# Patient Record
Sex: Female | Born: 1955 | Race: White | Hispanic: No | Marital: Married | Smoking: Former smoker
Health system: Southern US, Community
[De-identification: ages and names within clinical notes are randomized; demographics above are authoritative.]

## PROBLEM LIST (undated history)

## (undated) DIAGNOSIS — M419 Scoliosis, unspecified: Secondary | ICD-10-CM

## (undated) DIAGNOSIS — M545 Low back pain, unspecified: Secondary | ICD-10-CM

## (undated) DIAGNOSIS — N3289 Other specified disorders of bladder: Secondary | ICD-10-CM

## (undated) DIAGNOSIS — G8929 Other chronic pain: Secondary | ICD-10-CM

## (undated) DIAGNOSIS — M2540 Effusion, unspecified joint: Secondary | ICD-10-CM

## (undated) DIAGNOSIS — M1990 Unspecified osteoarthritis, unspecified site: Secondary | ICD-10-CM

## (undated) DIAGNOSIS — Z9289 Personal history of other medical treatment: Secondary | ICD-10-CM

## (undated) DIAGNOSIS — Z780 Asymptomatic menopausal state: Secondary | ICD-10-CM

## (undated) DIAGNOSIS — G629 Polyneuropathy, unspecified: Secondary | ICD-10-CM

## (undated) DIAGNOSIS — L409 Psoriasis, unspecified: Secondary | ICD-10-CM

## (undated) DIAGNOSIS — M2550 Pain in unspecified joint: Secondary | ICD-10-CM

## (undated) HISTORY — PX: BACK SURGERY: SHX140

## (undated) HISTORY — PX: COLONOSCOPY: SHX174

## (undated) HISTORY — PX: REFRACTIVE SURGERY: SHX103

## (undated) HISTORY — PX: ESOPHAGOGASTRODUODENOSCOPY: SHX1529

---

## 1989-06-05 HISTORY — PX: CHOLECYSTECTOMY: SHX55

## 1989-06-05 HISTORY — PX: ABDOMINAL HYSTERECTOMY: SHX81

## 1989-06-05 HISTORY — PX: APPENDECTOMY: SHX54

## 1998-10-05 HISTORY — PX: ANTERIOR FUSION CERVICAL SPINE: SUR626

## 2010-11-05 HISTORY — PX: LAMINECTOMY: SHX219

## 2010-11-07 ENCOUNTER — Encounter (HOSPITAL_COMMUNITY): Payer: BC Managed Care – PPO

## 2010-11-07 ENCOUNTER — Other Ambulatory Visit: Payer: Self-pay | Admitting: Orthopedic Surgery

## 2010-11-07 ENCOUNTER — Ambulatory Visit (HOSPITAL_COMMUNITY)
Admission: RE | Admit: 2010-11-07 | Discharge: 2010-11-07 | Disposition: A | Discharge status: Home / Self Care | Payer: BC Managed Care – PPO | Source: Ambulatory Visit | Attending: Orthopedic Surgery | Admitting: Orthopedic Surgery

## 2010-11-07 ENCOUNTER — Other Ambulatory Visit (HOSPITAL_COMMUNITY): Payer: Self-pay | Admitting: Orthopedic Surgery

## 2010-11-07 DIAGNOSIS — M4800 Spinal stenosis, site unspecified: Secondary | ICD-10-CM

## 2010-11-07 DIAGNOSIS — IMO0002 Reserved for concepts with insufficient information to code with codable children: Secondary | ICD-10-CM

## 2010-11-07 DIAGNOSIS — Z01818 Encounter for other preprocedural examination: Secondary | ICD-10-CM | POA: Insufficient documentation

## 2010-11-07 LAB — CBC
MCHC: 33.7 g/dL (ref 30.0–36.0)
Platelets: 189 10*3/uL (ref 150–400)
RDW: 12.9 % (ref 11.5–15.5)
WBC: 7.4 10*3/uL (ref 4.0–10.5)

## 2010-11-07 LAB — COMPREHENSIVE METABOLIC PANEL
ALT: 18 U/L (ref 0–35)
AST: 18 U/L (ref 0–37)
Alkaline Phosphatase: 53 U/L (ref 39–117)
CO2: 29 mEq/L (ref 19–32)
Calcium: 9.4 mg/dL (ref 8.4–10.5)
GFR calc Af Amer: >60 mL/min (ref >60)
GFR calc non Af Amer: >60 mL/min (ref >60)
Potassium: 4 mEq/L (ref 3.5–5.1)
Sodium: 142 mEq/L (ref 135–145)

## 2010-11-07 LAB — DIFFERENTIAL
Basophils Absolute: 0 10*3/uL (ref 0.0–0.1)
Basophils Relative: 0 % (ref 0–1)
Eosinophils Relative: 2 % (ref 0–5)
Monocytes Absolute: 0.6 10*3/uL (ref 0.1–1.0)

## 2010-11-07 LAB — URINE MICROSCOPIC-ADD ON

## 2010-11-07 LAB — URINALYSIS, ROUTINE W REFLEX MICROSCOPIC
Leukocytes, UA: NEGATIVE
Nitrite: POSITIVE — AB
Protein, ur: NEGATIVE mg/dL
Specific Gravity, Urine: 1.017 (ref 1.005–1.030)
Urobilinogen, UA: 0.2 mg/dL (ref 0.0–1.0)

## 2010-11-07 LAB — VITAMIN D 25 HYDROXY (VIT D DEFICIENCY, FRACTURES): Vit D, 25-Hydroxy: 36 ng/mL (ref 30–89)

## 2010-11-07 LAB — PROTIME-INR: Prothrombin Time: 12.5 seconds (ref 11.6–15.2)

## 2010-11-11 ENCOUNTER — Ambulatory Visit (HOSPITAL_COMMUNITY)
Admission: RE | Admit: 2010-11-11 | Payer: BC Managed Care – PPO | Source: Ambulatory Visit | Admitting: Orthopedic Surgery

## 2010-11-11 ENCOUNTER — Ambulatory Visit (HOSPITAL_COMMUNITY): Payer: BC Managed Care – PPO

## 2010-11-11 ENCOUNTER — Inpatient Hospital Stay (HOSPITAL_COMMUNITY)
Admission: RE | Admit: 2010-11-11 | Discharge: 2010-11-12 | DRG: 758 | Disposition: A | Discharge status: Home / Self Care | Payer: BC Managed Care – PPO | Source: Ambulatory Visit | Attending: Orthopedic Surgery | Admitting: Orthopedic Surgery

## 2010-11-11 DIAGNOSIS — Z87891 Personal history of nicotine dependence: Secondary | ICD-10-CM

## 2010-11-11 DIAGNOSIS — M48061 Spinal stenosis, lumbar region without neurogenic claudication: Principal | ICD-10-CM | POA: Diagnosis present

## 2010-11-11 LAB — TYPE AND SCREEN
ABO/RH(D): A POS
Antibody Screen: NEGATIVE

## 2010-11-11 LAB — ABO/RH: ABO/RH(D): A POS

## 2010-11-18 NOTE — Op Note (Signed)
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  POSTOPERATIVE DIAGNOSIS:  Left L4-5 stenosis, no disk herniation identified.  OPERATIVE PROCEDURE:  Left L4-5 laminar foraminotomy/decompression L5 nerve root.  OPERATIVE NOTE:  The patient was placed under general endotracheal anesthesia.  Antibiotics were infused intravenously for prophylaxis against infection.  Sequential compression devices were placed on both lower extremities.  I had previously seen the patient in the preoperative area and marked her back on the left side, confirming that her pain was left sciatic.  She was positioned prone on the [place] frame.  Care was taken to position the upper extremities so as to avoid hyperflexion and abduction of the shoulders so as to avoid hyperflexion of the elbows.  The upper extremities were padded with foam from axilla to hands.  The thighs, knees, shins, and ankles were supported on pillows.  A small midline skin mark was made over the incision was proposed based upon my judgment as to the location of the L4-5 disk space.  The lumbar area was then prepped with DuraPrep and draped in a rectangular fashion. The drapes were secured with Ioban.  A time-out was held at which point the usual information was confirmed/discussed.  The skin was scored in line with the proposed incision into the dermis. The subdermal and subcutaneous tissue was then injected with a mixture of 0.5% plain Marcaine and 1% lidocaine with epinephrine.  Incision was then  deepened down the thoracolumbar fascia and the fascia cast detached on the left side from the spinous process of what was perceived to be L4 and L5.  Paraspinal muscle was reflected to the left side.  Cross-table lateral radiograph was taken with a Kocher on what I had thought was probably L4 spinous process, but proved to be the L5 spinous process. This did produce some confusion and eventually I took another radiograph before performing a laminotomy with a Kocher on the trailing edge of what I had identified to be L5, immediately above the last mobile segment.  This confirmed our position and correct level was again confirmed later on the case with an 18-gauge needle placed in the disk itself.  We performed an inferomedial facetectomy of L4 on the left side using a high-speed bur.  Superior articular process facetectomy was also performed underlying the inferior articular process using Kerrison rongeurs.  Ligamentum flavum was detached from the upper edge of L5 and removed in the laminotomy site.  Eventually we had a defect measuring about 1.5 cm.  I was able to mobilize the dura and origins of the L5 nerve root and exposed the L4-5 disk.  Epidural veins were coagulated. I  was able to retract the nerve medial to the L5 pedicle in the area where the small disk herniation appeared to have its distal extent.  I had exposed the posterior aspect of the vertebra of L5 from disk to medial aspect of pedicle and could find no evidence of a fragment of disk nor defect in the disk space.  However, as I decompressed the lateral recess at L5 it was obvious that the L5 nerve root was compressed.  As noted above, a confirmatory radiograph was taken with a spinal needle in the L4-5 disk to confirm that we were at the intended level.  I used a Murphy ball probe to probe down into the L5-S1 neural foramen and also the L4-5 neural foramen above, which seems not to be compressed.  We then irrigated  the wound with antibiotic solution.  An 8-inch Hemovac drain was placed subfascially, brought out through the skin to the left side, and secured with a 2-0 nylon suture.  The thoracolumbar fascia was closed using multiple #1 Vicryl sutures.  Subcutaneous tissue was closed using multiple 2-0 undyed inverted simple sutures of Vicryl.  The skin was then closed using subcuticular 3-0 undyed Vicryl in a running fashion.  Nonadherent antibiotic ointment dressing was applied.  Blood loss estimated less than 100 mL.  There were no intraoperative complications.  At the time of dictation, the patient has not yet returned to the recovery room, so no neurologic examination is reported here.     Nelda Severe, MD     MT/MEDQ  D:  11/11/2010  T:  11/11/2010  Job:  [id number]  Electronically Signed by Nelda Severe MD on 11/18/2010 09:43:59 AM

## 2010-11-27 NOTE — Discharge Summary (Signed)
  [name], [name]                 ACCOUNT NO.:  [id number]  MEDICAL RECORD NO.:  [id number]           PATIENT TYPE:  I  LOCATION:  5029                         FACILITY:  [facility]  PHYSICIAN:  Nelda Severe, MD      DATE OF BIRTH:  [birth year]  DATE OF ADMISSION:  11/11/2010 DATE OF DISCHARGE:  11/12/2010                              DISCHARGE SUMMARY   FINAL DIAGNOSIS:  L4-L5 stenosis, left side.  BRIEF HISTORY:  She was brought to [facility] on November 11, 2010.  She underwent laminectomy, L4-L5.  Estimated blood loss was 100 mL.  No complications.  Postop day #1, the patient is ambulating independently throughout her room and in the hallway holding to the IV pole and she states her pain is significantly better like "not in day." She is afebrile.  Vital signs are stable.  I discontinued the drain and applied a clean dry dressing.  Incision was clean, dry, and intact.  No active drainage.  No erythema.  Distally neurovascularly motor intact. She has a 0/5 EHL on the left.  PLAN:  She will be discharged home under the care of her family.  She will walk for exercise.  No bending, stooping, or lifting.  She will be discharged with hydrocodone 10/325 one to two q.4 p.r.n. for pain and Robaxin 500 one to two q.6 p.r.n. for muscle spasms.  She will follow up in our office in approximately 4 weeks.  DISPOSITION:  Stable.  DIET:  Regular.     Lianne Cure, P.A.   ______________________________ Nelda Severe, MD    MC/MEDQ  D:  11/12/2010  T:  11/12/2010  Job:  [id number]  Electronically Signed by Lianne Cure P.A. on 11/21/2010 09:27:20 AM Electronically Signed by Nelda Severe MD on 11/27/2010 01:25:31 PM

## 2011-07-03 ENCOUNTER — Encounter (HOSPITAL_COMMUNITY)
Admission: RE | Admit: 2011-07-03 | Discharge: 2011-07-03 | Disposition: A | Discharge status: Home / Self Care | Payer: BC Managed Care – PPO | Source: Ambulatory Visit | Attending: Orthopedic Surgery | Admitting: Orthopedic Surgery

## 2011-07-03 ENCOUNTER — Other Ambulatory Visit (HOSPITAL_COMMUNITY): Payer: Self-pay | Admitting: Orthopedic Surgery

## 2011-07-03 DIAGNOSIS — M5430 Sciatica, unspecified side: Secondary | ICD-10-CM

## 2011-07-03 LAB — COMPREHENSIVE METABOLIC PANEL
ALT: 20 U/L (ref 0–35)
AST: 19 U/L (ref 0–37)
Albumin: 3.7 g/dL (ref 3.5–5.2)
Alkaline Phosphatase: 65 U/L (ref 39–117)
CO2: 30 mEq/L (ref 19–32)
Chloride: 103 mEq/L (ref 96–112)
GFR calc non Af Amer: >60 mL/min (ref >60)
Potassium: 4 mEq/L (ref 3.5–5.1)
Sodium: 141 mEq/L (ref 135–145)
Total Bilirubin: 0.6 mg/dL (ref 0.3–1.2)

## 2011-07-03 LAB — URINALYSIS, ROUTINE W REFLEX MICROSCOPIC
Bilirubin Urine: NEGATIVE
Glucose, UA: NEGATIVE mg/dL
Hgb urine dipstick: NEGATIVE
Ketones, ur: NEGATIVE mg/dL
Leukocytes, UA: NEGATIVE
Protein, ur: NEGATIVE mg/dL

## 2011-07-03 LAB — TYPE AND SCREEN

## 2011-07-03 LAB — CBC
MCH: 30.9 pg (ref 26.0–34.0)
Platelets: 207 10*3/uL (ref 150–400)
RBC: 4.33 MIL/uL (ref 3.87–5.11)
RDW: 12.8 % (ref 11.5–15.5)

## 2011-07-03 LAB — DIFFERENTIAL
Basophils Relative: 0 % (ref 0–1)
Eosinophils Absolute: 0.2 10*3/uL (ref 0.0–0.7)
Eosinophils Relative: 2 % (ref 0–5)
Monocytes Relative: 6 % (ref 3–12)
Neutrophils Relative %: 59 % (ref 43–77)

## 2011-07-03 LAB — URINE MICROSCOPIC-ADD ON

## 2011-07-03 LAB — PROTIME-INR: Prothrombin Time: 13.2 seconds (ref 11.6–15.2)

## 2011-07-06 HISTORY — PX: SPINAL CORD STIMULATOR INSERTION: SHX5378

## 2011-07-07 ENCOUNTER — Inpatient Hospital Stay (HOSPITAL_COMMUNITY)
Admission: RE | Admit: 2011-07-07 | Discharge: 2011-07-10 | DRG: 757 | Disposition: A | Discharge status: Home / Self Care | Payer: BC Managed Care – PPO | Source: Ambulatory Visit | Attending: Orthopedic Surgery | Admitting: Orthopedic Surgery

## 2011-07-07 ENCOUNTER — Ambulatory Visit (HOSPITAL_COMMUNITY): Payer: BC Managed Care – PPO

## 2011-07-07 DIAGNOSIS — T85695A Other mechanical complication of other nervous system device, implant or graft, initial encounter: Secondary | ICD-10-CM | POA: Diagnosis not present

## 2011-07-07 DIAGNOSIS — G8929 Other chronic pain: Secondary | ICD-10-CM | POA: Diagnosis present

## 2011-07-07 DIAGNOSIS — Z01812 Encounter for preprocedural laboratory examination: Secondary | ICD-10-CM

## 2011-07-07 DIAGNOSIS — Z01818 Encounter for other preprocedural examination: Secondary | ICD-10-CM

## 2011-07-07 DIAGNOSIS — M961 Postlaminectomy syndrome, not elsewhere classified: Principal | ICD-10-CM | POA: Diagnosis present

## 2011-07-07 DIAGNOSIS — F411 Generalized anxiety disorder: Secondary | ICD-10-CM | POA: Diagnosis present

## 2011-07-07 DIAGNOSIS — G609 Hereditary and idiopathic neuropathy, unspecified: Secondary | ICD-10-CM | POA: Diagnosis present

## 2011-07-07 DIAGNOSIS — Y831 Surgical operation with implant of artificial internal device as the cause of abnormal reaction of the patient, or of later complication, without mention of misadventure at the time of the procedure: Secondary | ICD-10-CM | POA: Diagnosis not present

## 2011-07-07 DIAGNOSIS — E669 Obesity, unspecified: Secondary | ICD-10-CM | POA: Diagnosis present

## 2011-07-09 ENCOUNTER — Inpatient Hospital Stay (HOSPITAL_COMMUNITY): Payer: BC Managed Care – PPO

## 2011-07-17 NOTE — Op Note (Signed)
NAMEMILISA, KIMBELL NO.:  [id number]  MEDICAL RECORD NO.:  [id number]  LOCATION:                                 FACILITY:  PHYSICIAN:  Nelda Severe, MD      DATE OF BIRTH:  [birth year]  DATE OF PROCEDURE:  07/09/2011 DATE OF DISCHARGE:                              OPERATIVE REPORT   SURGEON:  Nelda Severe, MD.  ASSISTANT:  Lianne Cure, PA.  PREOPERATIVE DIAGNOSIS:  Status post insertion of spinal cord stimulator, electrodes, and IPG with nonfunctioning electrode.  POSTOPERATIVE DIAGNOSIS:  Status post insertion of spinal cord stimulator, electrodes, and IPG with nonfunctioning electrode.  OPERATIVE PROCEDURE:  Revision of thoracic electrode positioning, re- exploration of thoracic wound.  OPERATIVE NOTE:  The patient was placed under general endotracheal anesthesia.  A gram of vancomycin had been infused for prophylaxis against infection.  Sequential compression devices were placed on both lower extremities.  She was positioned prone on a [place] frame.  Care was taken to position the upper extremities so as to avoid hyperflexion and abduction of the shoulders and so as to avoid hyperflexion of the elbows.  The thighs, knees, shins, and ankles were supported with pillows.  The previously applied dressings were removed.  The thoracolumbar area including the left-sided posterolateral wound where the extensions to the electrodes were coupled was prepped using Betadine solution.  The drapes were secured with Ioban.  A time-out was held, at which point, the usual parameters were discussed/confirmed.  I began by excising in elliptical fashion the thoracic laminectomy wound.  Prior to making the incision, I injected through intact skin on either side at the previous incision with a mixture of 0.5% plain Marcaine and 1% lidocaine with epinephrine.  We removed the remaining subcutaneous sutures and sutures in the thoracolumbar fascia.  Hematoma was  sucked out of the wound and then retractors were carefully placed so as to avoid damaging the electrode wires.  The problem at hand was a paddle electrode which was positioned such that the right-sided contacts were not functioning in terms of relieving her pain/covering her pain.  We had considerable difficulty at the time of the original insertion beginning the paddle to assume a central position with one rolled electrodes right of center and the other rolled electrodes left of center.  Ultimately, after approximately 10 trial passes, we accepted positioned which the right side electrodes were approximately in the center of the spine.  Initially, we inserted a single wire electrode to the right of the paddle electrode.  This seemed be acceptable in position, and we then exposed the area where the extensions had been spliced to the paddle electrode wires in a separate posterolateral incision.  This incision was also excised and the wires exposed and uncoupled.  We were in the process of trying to determine which wire belong to the left and right-sided electrode array when we had difficulty decoupling an electrode from the extension.  We had actually decoupled one electrode and tacked to the [employer] scientific rep tested it and we had the rod electrode, there being no real way to identify which one was right, which one was left  at a point remote from the exit from the laminectomy.  This electrode was then replaced and the coupling tightened down and [employer] rep tested, the system 1 or 2 contacts were not functioning.  Became evident that the electrode had to be inserted further into the coupling and unfortunately it had already been torqued.  We then could not extricated even though the torque screw was undone.  At this point, it became evident that we were going to have to remove the paddle electrode and insert another single electrode and probably replace the extensions.   The electrodes from the paddle were then cut in the remote wound and the paddle extricated.  In the meantime, my assistant, Lianne Cure, PA-C persisted in trying to decouple the stump of the electrode from the extension, eventually he was able to extricate it successfully.  At this point, we proceeded with considerable difficulty, replaced two single electrodes.  The real problem was the one on the right side. After removing the paddle electrode, the single strand electrode displaced and so we started a fresh with two single electrodes.  The left one assumed a good position right away, and not surprisingly we had tremendous difficulties with the right-sided one, securing it into correct position.  This was the same problem we had with the original insertion of the paddle electrode, insofar as there was always a tendency for the electrode to end up more left and right.  However, eventually, after probably 10-12 passes, we were able to get a single electrode, using fluoroscopic guidance, in a good position behind the T8 vertebral body on the right side.  As noted the left-sided electrode had been placed without too much difficulty.  We then secured anchors to the electrode and sutured the anchors to the spinous process of the distal vertebra at the laminectomy site.  Check AP and lateral fluoroscopy view showed satisfactory position of electrodes.  We then coupled the electrode wires with the extensions in the remote wound and the [employer] scientific rep tested the system and it was working.  All the couplings were then torqued.  The wound was sterilely irrigated with antibiotic solution on several occasions during the procedure.  An 8-inch Hemovac drain was placed subfascially and brought out through the skin to the right side where it was secured with a 2-0 nylon suture.  One gram of vancomycin was dusted into the central thoracic wound.  The thoracolumbar fascia was closed using  interrupted #1 Vicryl sutures.  The subcutaneous layer of both wounds was closed using interrupted 2-0 Vicryl suture.  The skin was closed with subcuticular running 3-0 undyed Vicryl on the remote site and interrupted nylon on the thoracic laminectomy site.  Dermabond was applied to the remote site and an antibiotic ointment dressing applied to the central wound.  Both dressings were secured with tape.  The blood loss estimated at less than 100 mL.  There were no intraoperative complications.  Sponge and needle counts were correct.     Nelda Severe, MD     MT/MEDQ  D:  07/09/2011  T:  07/09/2011  Job:  [id number]  Electronically Signed by Nelda Severe MD on 07/17/2011 02:15:11 PM

## 2011-07-17 NOTE — Op Note (Signed)
[name], [name] NO.:  [id number]  MEDICAL RECORD NO.:  [id number]  LOCATION:  5037                         FACILITY:  [facility]  PHYSICIAN:  Nelda Severe, MD      DATE OF BIRTH:  1956-03-23  DATE OF PROCEDURE: DATE OF DISCHARGE:                              OPERATIVE REPORT   SURGEON:  Nelda Severe, MD  ASSISTANT:  Lianne Cure, PA-C  PREOPERATIVE DIAGNOSIS:  Lumbar laminectomy syndrome, status post successful trial of spinal cord stimulator.  POSTOPERATIVE DIAGNOSIS:  Lumbar laminectomy syndrome, status post successful trial of spinal cord stimulator.  OPERATIVE PROCEDURE: 1. Thoracic laminectomy for insertion of permanent spinal cord     stimulator electrodes. 2. Placement of internal pulse generator left upper quadrant abdominal     area (subcutaneous).  OPERATIVE NOTE:  The patient was placed under general endotracheal anesthesia.  Sequential compression foot devices were placed on both lower extremities.  Intravenous antibiotics were infused.  A Foley catheter was placed in the bladder.  She was positioned prone on a [place] frame.  Care was taken to position the upper extremities so as to avoid hyperflexion and abduction of the shoulders so as to avoid hyperflexion of the elbows.  Upper extremities were padded from axilla to hands with foam.  Thighs, knees, shins, and ankles were supported on pillows.  The thoracolumbar area was prepped with DuraPrep and draped in rectangular fashion and the drape was secured with Ioban.  A time-out was held at which point the usual information was confirmed/discussed.  A C-arm fluoroscopy unit was sterilely draped.  It was wheeled into position for performance of AP imaging.  We marked the T12, T11, T10, T9 and T8 levels.  A staple was placed in the skin overlying the T8 level as a marker for placement of the permanent electrode.  A midline incision was made after injection of subcutaneous tissue  with a mixture of 0.5% plain Marcaine and 1% lidocaine with epinephrine. This was a vertical midline incision over the lower thoracic spine. Paraspinal muscles were cleared off the lamina at approximately T10-11. Interspinous ligament was removed as well as the trailing spinous process of the vertebra above.  I used a curette to separate the ligamentum flavum from the trailing edge of the superior vertebra.  I then passed a #3 Cytogeneticist to dissect off the ligamentum flavum from the undersurface of the superior vertebra.  I was then able to pass a plastic malleable instrument supplied with the electrode into the spinal canal.  Then, under fluoroscopic guidance, I positioned the electrode.  I tried repeatedly to get into the center behind the T8 body, but on every attempt it always oriented itself slightly to the left and ended up having to accept that position.  The anchor devices were attached to the electrode wires.  The anchoring devices were then torqued onto the wires.  They were then stitched to the spinous process of the distal vertebra at the laminectomy site through a hole made in the base of the spinous process, with a 2-0 Ethibond suture.  A small incision was then made laterally upon the left side just above the iliac crest.  A passing device was then passed with a cannula over it from a subfascial location to the central wound to the subcutaneous location of this wound.  Electrode wires were passed and buried in that wound and the wound was closed with a simple horizontal mattress suture of 2-0 nylon.  An eighth inch Hemovac drain was then placed subfascially in the laminectomy wound, brought out through the skin to the right side, and sutured with a 2-0 nylon suture.  The closure was then effected using interrupted #1 Vicryl in the thoracolumbar fascia, inverted 2-0 interrupted Vicryl in subcutaneous layer, and continuous 3- 0 undyed Vicryl running in the skin  layer.  The skin edges were reinforced with Dermabond.  A Mepilex dressing was then applied.  The patient was then transferred off the [place] frame onto a regular operating table with a beanbag on it.  The [place] frame was removed from the room.  The patient was then placed in a semilateral position, left side up right side down, so that the wound to which I had passed the electrode wires was accessible as well as the anterior abdomen.  After re-preparation of the area with the buried electrode as well as the abdomen with Betadine solution, drapes were applied and secured with Ioban.  A second time-out was held.  Next, I made an incision obliquely in the subcostal area on the left side.  This was carried into the subcutaneous tissue and a pocket made to contain the internal pulse generator.  We then opened the previously made small incision with the buried electrode wires and used the similar passing device, trocar with polyethylene cannula to create a tunnel between the two wounds and then place attachment extension electrodes onto the buried electrodes and passed them into the anterior wound. They were then attached to the internal pulse generator.  It was tested and functioned.  We then closed both wounds.  I then attempted using the charging device in a sterile bag to test the ability to charge the IPG. It did seem that the IPG must have been too deeply implanted. Therefore, I took out the stitches.  I used an interrupted 2-0 Vicryl suture in the more medial part of the wound to prevent the implant from migrating in that direction and created a somewhat more superficial pocket laterally.  I then closed that wound using 2-0 interrupted Vicryl in the subcutaneous tissue.  The charging device worked.  We then closed the skin with continuous subcuticular 3-0 undyed Vicryl and reinforced the skin edges with Dermabond.  A Mepilex dressing was applied.  The patient was then transferred  to her bed, awakened, and returned to the recovery room in satisfactory condition at the time of dictation.  She is able to move both lower extremities without difficulty.  The IPG will be turned on by the [facility] rep tomorrow.  There were no intraoperative complications.  Blood loss approximately 100 mL or less.  Sponge and needle counts were correct.     Nelda Severe, MD    MT/MEDQ  D:  07/07/2011  T:  07/08/2011  Job:  [id number]  Electronically Signed by Nelda Severe MD on 07/17/2011 02:15:09 PM

## 2011-07-17 NOTE — Discharge Summary (Signed)
  NAMEALLESHA, ARONOFF NO.:  [id number]  MEDICAL RECORD NO.:  [id number]  LOCATION:  5037                         FACILITY:  [facility]  PHYSICIAN:  Nelda Severe, MD      DATE OF BIRTH:  25-May-1956  DATE OF ADMISSION:  07/07/2011 DATE OF DISCHARGE:  07/10/2011                              DISCHARGE SUMMARY   FINAL DIAGNOSIS:  Post laminectomy syndrome.  BRIEF HISTORY:  She was brought into [facility] on July 07, 2011.  She underwent permanent spinal cord stimulator implant on July 07, 2011.  Estimated blood loss 100 mL.  Moving extremities. Postoperatively, she was admitted to 5037.  [employer] engineer, Angelena Form came in to adjust the stimulator the postop day #1 and she was getting only left-sided coverage.  Our plan was to revise the stimulator.  Revision was performed on July 09, 2011.  We removed the single pad and 2 separate electrodes one to the right and one to the left.  Postop day #1 from second surgery revision of stimulator on July 10, 2011, she is receiving coverage bilaterally. Her incision is clean, dry and intact.  She is ambulating independently throughout the halls.  No new musculoskeletal complaints.  She is going to be discharged under the care of her sister.  She will follow up in our office in 1 week for suture removal and x-rays for placement and positioning.  She will be sent home on her regularly scheduled medications to include Wellbutrin 150 p.o. daily, estradiol 2 mg p.o. b.i.d., multivitamin p.o. daily, hydrocodone 10/325 one to two p.o. q. 4 p.r.n., gabapentin 600 mg p.o. t.i.d., and we will also give her a new prescription for Robaxin 500 one q.6 p.r.n. for muscle spasms.  She will follow up in our office in approximately a week.  DIET:  Regular.  DISPOSITION:  Stable.  ACTIVITIES:  She will walk for exercise.  No bending, stooping, or lifting.  DISCHARGE INSTRUCTIONS:  She may remove the  dressings in 24 hours and shower, apply clean dry dressing if there is drainage.     Lianne Cure, P.A.   ______________________________ Nelda Severe, MD    MC/MEDQ  D:  07/10/2011  T:  07/10/2011  Job:  [id number]  Electronically Signed by Lianne Cure P.A. on 07/13/2011 08:32:44 AM Electronically Signed by Nelda Severe MD on 07/17/2011 02:15:15 PM

## 2012-06-05 HISTORY — PX: GUM SURGERY: SHX658

## 2012-06-14 ENCOUNTER — Encounter (HOSPITAL_COMMUNITY): Payer: Self-pay | Admitting: Pharmacy Technician

## 2012-06-17 ENCOUNTER — Encounter (HOSPITAL_COMMUNITY): Payer: Self-pay

## 2012-06-17 ENCOUNTER — Encounter (HOSPITAL_COMMUNITY)
Admission: RE | Admit: 2012-06-17 | Discharge: 2012-06-17 | Disposition: A | Discharge status: Home / Self Care | Payer: BC Managed Care – PPO | Source: Ambulatory Visit | Attending: Orthopedic Surgery | Admitting: Orthopedic Surgery

## 2012-06-17 HISTORY — DX: Scoliosis, unspecified: M41.9

## 2012-06-17 HISTORY — DX: Asymptomatic menopausal state: Z78.0

## 2012-06-17 HISTORY — DX: Polyneuropathy, unspecified: G62.9

## 2012-06-17 HISTORY — DX: Effusion, unspecified joint: M25.40

## 2012-06-17 HISTORY — DX: Psoriasis, unspecified: L40.9

## 2012-06-17 HISTORY — DX: Unspecified osteoarthritis, unspecified site: M19.90

## 2012-06-17 HISTORY — DX: Other specified disorders of bladder: N32.89

## 2012-06-17 HISTORY — DX: Pain in unspecified joint: M25.50

## 2012-06-17 LAB — COMPREHENSIVE METABOLIC PANEL
ALT: 18 U/L (ref 0–35)
AST: 17 U/L (ref 0–37)
Albumin: 3.7 g/dL (ref 3.5–5.2)
Alkaline Phosphatase: 63 U/L (ref 39–117)
Chloride: 102 mEq/L (ref 96–112)
Potassium: 3.7 mEq/L (ref 3.5–5.1)
Total Bilirubin: 0.6 mg/dL (ref 0.3–1.2)

## 2012-06-17 LAB — CBC
Platelets: 187 10*3/uL (ref 150–400)
RBC: 4.6 MIL/uL (ref 3.87–5.11)
WBC: 8.1 10*3/uL (ref 4.0–10.5)

## 2012-06-17 LAB — URINALYSIS, ROUTINE W REFLEX MICROSCOPIC
Ketones, ur: NEGATIVE mg/dL
Leukocytes, UA: NEGATIVE
Nitrite: NEGATIVE
Specific Gravity, Urine: 1.021 (ref 1.005–1.030)
pH: 5 (ref 5.0–8.0)

## 2012-06-17 LAB — TYPE AND SCREEN

## 2012-06-17 LAB — APTT: aPTT: 26 seconds (ref 24–37)

## 2012-06-17 LAB — SURGICAL PCR SCREEN: Staphylococcus aureus: NEGATIVE

## 2012-06-17 MED ORDER — CHLORHEXIDINE GLUCONATE 4 % EX LIQD
60.0000 mL | Freq: Once | CUTANEOUS | Status: DC
  Filled 2012-06-17: qty 60

## 2012-06-17 NOTE — Progress Notes (Signed)
Pt doesn't have a cardiologist  Denies ever having an echo/heart cath/stress test  Medical MD is Dr.Chung  CXR in epic from 07/02/12  Last ekg was in 11/2010-

## 2012-06-17 NOTE — Pre-Procedure Instructions (Signed)
20 [name]  06/17/2012   Your procedure is scheduled on: Tues, Sept 24 @ 10:10 AM  Report to [facility] at 8:00 AM.  Call this number if you have problems the morning of surgery: [phone]   Remember:   Do not eat food:After Midnight.    Take these medicines the morning of surgery with A SIP OF WATER: Wellbutrin(Buproprion),Gabapentin(Neurontin),and Pain Pill(if needed)   Do not wear jewelry, make-up or nail polish.  Do not wear lotions, powders, or perfumes. You may wear deodorant.  Do not shave 48 hours prior to surgery.   Do not bring valuables to the hospital.  Contacts, dentures or bridgework may not be worn into surgery.  Leave suitcase in the car. After surgery it may be brought to your room.  For patients admitted to the hospital, checkout time is 11:00 AM the day of discharge.   Patients discharged the day of surgery will not be allowed to drive home.  Special Instructions: Incentive Spirometry - Practice and bring it with you on the day of surgery. and CHG Shower Use Special Wash: 1/2 bottle night before surgery and 1/2 bottle morning of surgery.   Please read over the following fact sheets that you were given: Pain Booklet, Coughing and Deep Breathing, Blood Transfusion Information, MRSA Information and Surgical Site Infection Prevention

## 2012-06-22 ENCOUNTER — Other Ambulatory Visit: Payer: Self-pay

## 2012-06-27 MED ORDER — SODIUM CHLORIDE 0.9 % IV SOLN: 75.0000 mL/h | INTRAVENOUS | Status: DC

## 2012-06-27 MED ORDER — CEFAZOLIN SODIUM-DEXTROSE 2-3 GM-% IV SOLR
2.0000 g | INTRAVENOUS | Status: AC
  Administered 2012-06-28: 2 g via INTRAVENOUS
  Filled 2012-06-27: qty 50

## 2012-06-27 NOTE — Progress Notes (Signed)
NOTIFIED PATIENT OF TIME CHANGE, PATIENT INSTRUCTED TO ARRIVE AT 0530 AM FOR 0730 AM SURGERY ON 06/28/12.

## 2012-06-28 ENCOUNTER — Ambulatory Visit (HOSPITAL_COMMUNITY): Payer: BC Managed Care – PPO

## 2012-06-28 ENCOUNTER — Encounter (HOSPITAL_COMMUNITY): Payer: Self-pay | Admitting: Certified Registered"

## 2012-06-28 ENCOUNTER — Encounter (HOSPITAL_COMMUNITY)
Admission: RE | Disposition: A | Discharge status: Home / Self Care | Payer: Self-pay | Source: Ambulatory Visit | Attending: Orthopedic Surgery

## 2012-06-28 ENCOUNTER — Inpatient Hospital Stay (HOSPITAL_COMMUNITY)
Admission: RE | Admit: 2012-06-28 | Discharge: 2012-07-01 | DRG: 884 | Disposition: A | Discharge status: Home / Self Care | Payer: BC Managed Care – PPO | Source: Ambulatory Visit | Attending: Orthopedic Surgery | Admitting: Orthopedic Surgery

## 2012-06-28 ENCOUNTER — Ambulatory Visit (HOSPITAL_COMMUNITY): Payer: BC Managed Care – PPO | Admitting: Certified Registered"

## 2012-06-28 ENCOUNTER — Encounter (HOSPITAL_COMMUNITY): Payer: Self-pay | Admitting: *Deleted

## 2012-06-28 ENCOUNTER — Encounter (HOSPITAL_COMMUNITY): Payer: Self-pay | Admitting: General Practice

## 2012-06-28 DIAGNOSIS — Y831 Surgical operation with implant of artificial internal device as the cause of abnormal reaction of the patient, or of later complication, without mention of misadventure at the time of the procedure: Secondary | ICD-10-CM | POA: Diagnosis present

## 2012-06-28 DIAGNOSIS — Z23 Encounter for immunization: Secondary | ICD-10-CM

## 2012-06-28 DIAGNOSIS — M47816 Spondylosis without myelopathy or radiculopathy, lumbar region: Secondary | ICD-10-CM

## 2012-06-28 DIAGNOSIS — Z01812 Encounter for preprocedural laboratory examination: Secondary | ICD-10-CM

## 2012-06-28 DIAGNOSIS — K560 Paralytic ileus: Secondary | ICD-10-CM | POA: Diagnosis present

## 2012-06-28 DIAGNOSIS — F411 Generalized anxiety disorder: Secondary | ICD-10-CM | POA: Diagnosis present

## 2012-06-28 DIAGNOSIS — Z87891 Personal history of nicotine dependence: Secondary | ICD-10-CM

## 2012-06-28 DIAGNOSIS — K929 Disease of digestive system, unspecified: Secondary | ICD-10-CM | POA: Diagnosis present

## 2012-06-28 DIAGNOSIS — Z01811 Encounter for preprocedural respiratory examination: Secondary | ICD-10-CM

## 2012-06-28 DIAGNOSIS — M5137 Other intervertebral disc degeneration, lumbosacral region: Secondary | ICD-10-CM

## 2012-06-28 DIAGNOSIS — M4120 Other idiopathic scoliosis, site unspecified: Principal | ICD-10-CM | POA: Diagnosis present

## 2012-06-28 HISTORY — DX: Low back pain: M54.5

## 2012-06-28 HISTORY — PX: ANTERIOR LUMBAR FUSION: SHX1170

## 2012-06-28 HISTORY — DX: Low back pain, unspecified: M54.50

## 2012-06-28 HISTORY — DX: Other chronic pain: G89.29

## 2012-06-28 SURGERY — ANTERIOR LUMBAR FUSION 2 LEVELS
Anesthesia: General | Site: Back | Laterality: Bilateral | Wound class: Clean

## 2012-06-28 MED ORDER — SODIUM CHLORIDE 0.9 % IR SOLN
Status: DC | PRN
  Administered 2012-06-28: 09:00:00

## 2012-06-28 MED ORDER — GABAPENTIN 600 MG PO TABS
600.0000 mg | ORAL_TABLET | ORAL | Status: DC
  Administered 2012-06-30: 600 mg via ORAL
  Filled 2012-06-28 (×7): qty 1

## 2012-06-28 MED ORDER — SODIUM CHLORIDE 0.9 % IJ SOLN: 3.0000 mL | INTRAMUSCULAR | Status: DC | PRN

## 2012-06-28 MED ORDER — ROCURONIUM BROMIDE 100 MG/10ML IV SOLN
INTRAVENOUS | Status: DC | PRN
  Administered 2012-06-28: 50 mg via INTRAVENOUS

## 2012-06-28 MED ORDER — METHOCARBAMOL 100 MG/ML IJ SOLN
500.0000 mg | Freq: Four times a day (QID) | INTRAVENOUS | Status: DC | PRN
  Filled 2012-06-28 (×2): qty 5

## 2012-06-28 MED ORDER — LIDOCAINE HCL (PF) 1 % IJ SOLN
INTRAMUSCULAR | Status: AC
  Filled 2012-06-28: qty 30

## 2012-06-28 MED ORDER — PROMETHAZINE HCL 25 MG/ML IJ SOLN: 6.2500 mg | INTRAMUSCULAR | Status: DC | PRN

## 2012-06-28 MED ORDER — DIPHENHYDRAMINE HCL 50 MG/ML IJ SOLN
INTRAMUSCULAR | Status: AC
  Filled 2012-06-28: qty 1

## 2012-06-28 MED ORDER — MORPHINE SULFATE (PF) 1 MG/ML IV SOLN
INTRAVENOUS | Status: AC
  Filled 2012-06-28: qty 25

## 2012-06-28 MED ORDER — HYDROMORPHONE HCL PF 1 MG/ML IJ SOLN
INTRAMUSCULAR | Status: DC | PRN
  Administered 2012-06-28 (×2): 0.5 mg via INTRAVENOUS

## 2012-06-28 MED ORDER — BUPIVACAINE HCL (PF) 0.25 % IJ SOLN
INTRAMUSCULAR | Status: AC
  Filled 2012-06-28: qty 30

## 2012-06-28 MED ORDER — INFLUENZA VIRUS VACC SPLIT PF IM SUSP
0.5000 mL | INTRAMUSCULAR | Status: AC
  Administered 2012-06-29: 0.5 mL via INTRAMUSCULAR
  Filled 2012-06-28: qty 0.5

## 2012-06-28 MED ORDER — LACTATED RINGERS IV SOLN
INTRAVENOUS | Status: DC | PRN
  Administered 2012-06-28 (×3): via INTRAVENOUS

## 2012-06-28 MED ORDER — SODIUM CHLORIDE 0.9 % IV SOLN
10.0000 mg | INTRAVENOUS | Status: DC | PRN
  Administered 2012-06-28: 20 ug/min via INTRAVENOUS

## 2012-06-28 MED ORDER — GABAPENTIN 600 MG PO TABS
1200.0000 mg | ORAL_TABLET | Freq: Every day | ORAL | Status: DC
  Filled 2012-06-28 (×4): qty 2

## 2012-06-28 MED ORDER — POTASSIUM CHLORIDE IN NACL 20-0.45 MEQ/L-% IV SOLN
INTRAVENOUS | Status: DC
  Administered 2012-06-28: 14:00:00 via INTRAVENOUS
  Filled 2012-06-28 (×10): qty 1000

## 2012-06-28 MED ORDER — VECURONIUM BROMIDE 10 MG IV SOLR
INTRAVENOUS | Status: DC | PRN
  Administered 2012-06-28 (×2): 3 mg via INTRAVENOUS
  Administered 2012-06-28: 4 mg via INTRAVENOUS
  Administered 2012-06-28: 2 mg via INTRAVENOUS
  Administered 2012-06-28 (×2): 3 mg via INTRAVENOUS

## 2012-06-28 MED ORDER — MORPHINE SULFATE (PF) 1 MG/ML IV SOLN
INTRAVENOUS | Status: DC
  Administered 2012-06-28: 7.5 mg via INTRAVENOUS
  Administered 2012-06-28: 4.5 mg via INTRAVENOUS
  Administered 2012-06-28 – 2012-06-29 (×2): via INTRAVENOUS
  Filled 2012-06-28: qty 25

## 2012-06-28 MED ORDER — DIPHENHYDRAMINE HCL 50 MG/ML IJ SOLN
12.5000 mg | Freq: Four times a day (QID) | INTRAMUSCULAR | Status: DC | PRN
  Administered 2012-06-28 – 2012-06-29 (×2): 12.5 mg via INTRAVENOUS
  Filled 2012-06-28: qty 1

## 2012-06-28 MED ORDER — MENTHOL 3 MG MT LOZG: 1.0000 | LOZENGE | OROMUCOSAL | Status: DC | PRN

## 2012-06-28 MED ORDER — VITAMIN C 500 MG PO TABS
1000.0000 mg | ORAL_TABLET | Freq: Two times a day (BID) | ORAL | Status: DC
  Filled 2012-06-28 (×4): qty 2

## 2012-06-28 MED ORDER — THROMBIN 20000 UNITS EX SOLR
CUTANEOUS | Status: AC
  Filled 2012-06-28: qty 20000

## 2012-06-28 MED ORDER — FENTANYL CITRATE 0.05 MG/ML IJ SOLN
INTRAMUSCULAR | Status: DC | PRN
  Administered 2012-06-28: 100 ug via INTRAVENOUS
  Administered 2012-06-28: 150 ug via INTRAVENOUS
  Administered 2012-06-28: 100 ug via INTRAVENOUS
  Administered 2012-06-28: 150 ug via INTRAVENOUS

## 2012-06-28 MED ORDER — SODIUM CHLORIDE 0.9 % IJ SOLN
3.0000 mL | Freq: Two times a day (BID) | INTRAMUSCULAR | Status: DC
  Administered 2012-06-30 (×2): 3 mL via INTRAVENOUS

## 2012-06-28 MED ORDER — GABAPENTIN 600 MG PO TABS: 600.0000 mg | ORAL_TABLET | Freq: Three times a day (TID) | ORAL | Status: DC

## 2012-06-28 MED ORDER — PROPOFOL 10 MG/ML IV BOLUS
INTRAVENOUS | Status: DC | PRN
  Administered 2012-06-28: 200 mg via INTRAVENOUS

## 2012-06-28 MED ORDER — GLYCOPYRROLATE 0.2 MG/ML IJ SOLN
INTRAMUSCULAR | Status: DC | PRN
  Administered 2012-06-28: 0.2 mg via INTRAVENOUS
  Administered 2012-06-28: .6 mg via INTRAVENOUS

## 2012-06-28 MED ORDER — ACETAMINOPHEN 650 MG RE SUPP: 650.0000 mg | RECTAL | Status: DC | PRN

## 2012-06-28 MED ORDER — HYDROMORPHONE HCL PF 1 MG/ML IJ SOLN
INTRAMUSCULAR | Status: AC
  Filled 2012-06-28: qty 1

## 2012-06-28 MED ORDER — SODIUM CHLORIDE 0.9 % IJ SOLN: 9.0000 mL | INTRAMUSCULAR | Status: DC | PRN

## 2012-06-28 MED ORDER — NEOSTIGMINE METHYLSULFATE 1 MG/ML IJ SOLN
INTRAMUSCULAR | Status: DC | PRN
  Administered 2012-06-28: 4 mg via INTRAVENOUS

## 2012-06-28 MED ORDER — MIDAZOLAM HCL 5 MG/5ML IJ SOLN
INTRAMUSCULAR | Status: DC | PRN
  Administered 2012-06-28: 2 mg via INTRAVENOUS

## 2012-06-28 MED ORDER — NALOXONE HCL 0.4 MG/ML IJ SOLN: 0.4000 mg | INTRAMUSCULAR | Status: DC | PRN

## 2012-06-28 MED ORDER — WARFARIN - PHARMACIST DOSING INPATIENT: Freq: Every day | Status: DC

## 2012-06-28 MED ORDER — ONDANSETRON HCL 4 MG/2ML IJ SOLN
INTRAMUSCULAR | Status: AC
  Filled 2012-06-28: qty 2

## 2012-06-28 MED ORDER — ACETAMINOPHEN 325 MG PO TABS: 650.0000 mg | ORAL_TABLET | ORAL | Status: DC | PRN

## 2012-06-28 MED ORDER — CEFAZOLIN SODIUM 1-5 GM-% IV SOLN
1.0000 g | Freq: Three times a day (TID) | INTRAVENOUS | Status: AC
  Administered 2012-06-28 – 2012-06-29 (×2): 1 g via INTRAVENOUS
  Filled 2012-06-28 (×3): qty 50

## 2012-06-28 MED ORDER — LIDOCAINE HCL (CARDIAC) 20 MG/ML IV SOLN
INTRAVENOUS | Status: DC | PRN
  Administered 2012-06-28: 20 mg via INTRAVENOUS

## 2012-06-28 MED ORDER — BUPROPION HCL ER (SR) 150 MG PO TB12
150.0000 mg | ORAL_TABLET | Freq: Two times a day (BID) | ORAL | Status: DC
  Filled 2012-06-28 (×4): qty 1

## 2012-06-28 MED ORDER — PHENOL 1.4 % MT LIQD: 1.0000 | OROMUCOSAL | Status: DC | PRN

## 2012-06-28 MED ORDER — PANTOPRAZOLE SODIUM 40 MG IV SOLR
40.0000 mg | Freq: Every day | INTRAVENOUS | Status: DC
  Administered 2012-06-28: 40 mg via INTRAVENOUS
  Filled 2012-06-28 (×4): qty 40

## 2012-06-28 MED ORDER — ONDANSETRON HCL 4 MG/2ML IJ SOLN
4.0000 mg | Freq: Four times a day (QID) | INTRAMUSCULAR | Status: DC | PRN
  Administered 2012-06-28: 4 mg via INTRAVENOUS

## 2012-06-28 MED ORDER — MEPERIDINE HCL 25 MG/ML IJ SOLN: 6.2500 mg | INTRAMUSCULAR | Status: DC | PRN

## 2012-06-28 MED ORDER — PHENYLEPHRINE HCL 10 MG/ML IJ SOLN
INTRAMUSCULAR | Status: DC | PRN
  Administered 2012-06-28: 120 ug via INTRAVENOUS
  Administered 2012-06-28 (×3): 80 ug via INTRAVENOUS

## 2012-06-28 MED ORDER — ESTRADIOL 1 MG PO TABS
1.0000 mg | ORAL_TABLET | Freq: Every day | ORAL | Status: DC
  Administered 2012-06-30: 1 mg via ORAL
  Filled 2012-06-28 (×5): qty 1

## 2012-06-28 MED ORDER — HYDROMORPHONE HCL PF 1 MG/ML IJ SOLN
INTRAMUSCULAR | Status: AC
  Administered 2012-06-28: 0.5 mg via INTRAVENOUS
  Filled 2012-06-28: qty 1

## 2012-06-28 MED ORDER — HYDROMORPHONE HCL PF 1 MG/ML IJ SOLN
0.2500 mg | INTRAMUSCULAR | Status: DC | PRN
  Administered 2012-06-28 (×3): 0.5 mg via INTRAVENOUS

## 2012-06-28 MED ORDER — ONDANSETRON HCL 4 MG/2ML IJ SOLN
INTRAMUSCULAR | Status: DC | PRN
  Administered 2012-06-28: 4 mg via INTRAVENOUS

## 2012-06-28 MED ORDER — DIPHENHYDRAMINE HCL 12.5 MG/5ML PO ELIX
12.5000 mg | ORAL_SOLUTION | Freq: Four times a day (QID) | ORAL | Status: DC | PRN
  Administered 2012-06-29: 12.5 mg via ORAL
  Filled 2012-06-28: qty 5

## 2012-06-28 MED ORDER — MIDAZOLAM HCL 2 MG/2ML IJ SOLN: 0.5000 mg | Freq: Once | INTRAMUSCULAR | Status: DC | PRN

## 2012-06-28 MED ORDER — HYDROCODONE-ACETAMINOPHEN 10-325 MG PO TABS
1.0000 | ORAL_TABLET | ORAL | Status: DC | PRN
  Administered 2012-06-29: 2 via ORAL
  Administered 2012-06-29: 1 via ORAL
  Administered 2012-06-29: 2 via ORAL
  Administered 2012-06-30 – 2012-07-01 (×6): 1 via ORAL
  Filled 2012-06-28 (×2): qty 2
  Filled 2012-06-28 (×4): qty 1
  Filled 2012-06-28: qty 2
  Filled 2012-06-28 (×2): qty 1

## 2012-06-28 MED ORDER — THROMBIN 20000 UNITS EX SOLR
CUTANEOUS | Status: DC | PRN
  Administered 2012-06-28: 09:00:00 via TOPICAL

## 2012-06-28 MED ORDER — SODIUM CHLORIDE 0.9 % IV SOLN: 250.0000 mL | INTRAVENOUS | Status: DC

## 2012-06-28 MED ORDER — DOUBLE ANTIBIOTIC 500-10000 UNIT/GM EX OINT
TOPICAL_OINTMENT | CUTANEOUS | Status: AC
  Filled 2012-06-28: qty 1

## 2012-06-28 MED ORDER — WARFARIN SODIUM 7.5 MG PO TABS
7.5000 mg | ORAL_TABLET | Freq: Once | ORAL | Status: AC
  Administered 2012-06-28: 7.5 mg via ORAL
  Filled 2012-06-28: qty 1

## 2012-06-28 MED ORDER — ONDANSETRON HCL 4 MG/2ML IJ SOLN: 4.0000 mg | INTRAMUSCULAR | Status: DC | PRN

## 2012-06-28 SURGICAL SUPPLY — 99 items
APPLIER CLIP 11 MED OPEN (CLIP) ×6
BENZOIN TINCTURE PRP APPL 2/3 (GAUZE/BANDAGES/DRESSINGS) ×3 IMPLANT
CANISTER SUCTION 2500CC (MISCELLANEOUS) ×3 IMPLANT
CLIP APPLIE 11 MED OPEN (CLIP) ×4 IMPLANT
CLIP TI MEDIUM 24 (CLIP) ×3 IMPLANT
CLIP TI WIDE RED SMALL 24 (CLIP) ×3 IMPLANT
CLOTH BEACON ORANGE TIMEOUT ST (SAFETY) ×6 IMPLANT
CLSR STERI-STRIP ANTIMIC 1/2X4 (GAUZE/BANDAGES/DRESSINGS) ×3 IMPLANT
CORDS BIPOLAR (ELECTRODE) ×3 IMPLANT
COVER SURGICAL LIGHT HANDLE (MISCELLANEOUS) ×9 IMPLANT
COVER TABLE BACK 60X90 (DRAPES) ×3 IMPLANT
DERMABOND ADVANCED (GAUZE/BANDAGES/DRESSINGS) ×1
DERMABOND ADVANCED .7 DNX12 (GAUZE/BANDAGES/DRESSINGS) ×2 IMPLANT
DRAIN TLS ROUND 10FR (DRAIN) IMPLANT
DRAPE C-ARM 42X72 X-RAY (DRAPES) ×6 IMPLANT
DRAPE INCISE IOBAN 66X45 STRL (DRAPES) ×3 IMPLANT
DRAPE PROXIMA HALF (DRAPES) ×9 IMPLANT
DRAPE SURG 17X23 STRL (DRAPES) ×12 IMPLANT
DRAPE TABLE COVER HEAVY DUTY (DRAPES) ×3 IMPLANT
DRSG MEPILEX BORDER 4X12 (GAUZE/BANDAGES/DRESSINGS) IMPLANT
DRSG MEPILEX BORDER 4X8 (GAUZE/BANDAGES/DRESSINGS) ×3 IMPLANT
ELECT BLADE 4.0 EZ CLEAN MEGAD (MISCELLANEOUS) ×3
ELECT CAUTERY BLADE 6.4 (BLADE) ×3 IMPLANT
ELECT REM PT RETURN 9FT ADLT (ELECTROSURGICAL) ×3
ELECTRODE BLDE 4.0 EZ CLN MEGD (MISCELLANEOUS) ×2 IMPLANT
ELECTRODE REM PT RTRN 9FT ADLT (ELECTROSURGICAL) ×2 IMPLANT
EVACUATOR 1/8 PVC DRAIN (DRAIN) IMPLANT
GAUZE SPONGE 4X4 16PLY XRAY LF (GAUZE/BANDAGES/DRESSINGS) ×6 IMPLANT
GLOVE BIOGEL PI IND STRL 7.5 (GLOVE) ×4 IMPLANT
GLOVE BIOGEL PI IND STRL 9 (GLOVE) ×2 IMPLANT
GLOVE BIOGEL PI INDICATOR 7.5 (GLOVE) ×2
GLOVE BIOGEL PI INDICATOR 9 (GLOVE) ×1
GLOVE SS BIOGEL STRL SZ 7 (GLOVE) ×2 IMPLANT
GLOVE SS BIOGEL STRL SZ 8.5 (GLOVE) ×2 IMPLANT
GLOVE SUPERSENSE BIOGEL SZ 7 (GLOVE) ×1
GLOVE SUPERSENSE BIOGEL SZ 8.5 (GLOVE) ×1
GLOVE SURG SS PI 7.5 STRL IVOR (GLOVE) ×3 IMPLANT
GOWN PREVENTION PLUS XLARGE (GOWN DISPOSABLE) ×3 IMPLANT
GOWN PREVENTION PLUS XXLARGE (GOWN DISPOSABLE) ×3 IMPLANT
GOWN STRL NON-REIN LRG LVL3 (GOWN DISPOSABLE) ×9 IMPLANT
HEMOSTAT SURGICEL 2X14 (HEMOSTASIS) IMPLANT
INSERT FOGARTY 61MM (MISCELLANEOUS) IMPLANT
INSERT FOGARTY SM (MISCELLANEOUS) IMPLANT
KIT BASIN OR (CUSTOM PROCEDURE TRAY) ×3 IMPLANT
KIT ROOM TURNOVER OR (KITS) ×6 IMPLANT
LOOP VESSEL MAXI BLUE (MISCELLANEOUS) ×3 IMPLANT
LOOP VESSEL MINI RED (MISCELLANEOUS) ×3 IMPLANT
NEEDLE BONE MARROW 8GAX6 (NEEDLE) IMPLANT
NEEDLE SPNL 18GX3.5 QUINCKE PK (NEEDLE) ×3 IMPLANT
NEEDLE SPNL 22GX3.5 QUINCKE BK (NEEDLE) ×3 IMPLANT
NS IRRIG 1000ML POUR BTL (IV SOLUTION) ×3 IMPLANT
PACK LAMINECTOMY ORTHO (CUSTOM PROCEDURE TRAY) ×3 IMPLANT
PAD ARMBOARD 7.5X6 YLW CONV (MISCELLANEOUS) ×12 IMPLANT
PUTTY BONE BIOATIVE 10CC (Putty) ×3 IMPLANT
PUTTY NOVABONE SYR 10CC (Putty) ×3 IMPLANT
SCREW .5X25 (Screw) ×18 IMPLANT
SPONGE GAUZE 4X4 12PLY (GAUZE/BANDAGES/DRESSINGS) ×3 IMPLANT
SPONGE INTESTINAL PEANUT (DISPOSABLE) ×9 IMPLANT
SPONGE LAP 18X18 X RAY DECT (DISPOSABLE) ×3 IMPLANT
SPONGE LAP 4X18 X RAY DECT (DISPOSABLE) IMPLANT
SPONGE SURGIFOAM ABS GEL 100 (HEMOSTASIS) ×3 IMPLANT
STAPLER VISISTAT 35W (STAPLE) IMPLANT
STRIP CLOSURE SKIN 1/2X4 (GAUZE/BANDAGES/DRESSINGS) ×3 IMPLANT
SURGIFLO TRUKIT (HEMOSTASIS) ×3 IMPLANT
SUT ETHILON 2 0 FS 18 (SUTURE) IMPLANT
SUT MNCRL AB 4-0 PS2 18 (SUTURE) IMPLANT
SUT PDS AB 1 CTX 36 (SUTURE) ×3 IMPLANT
SUT PROLENE 4 0 RB 1 (SUTURE)
SUT PROLENE 4-0 RB1 .5 CRCL 36 (SUTURE) IMPLANT
SUT PROLENE 5 0 CC1 (SUTURE) IMPLANT
SUT PROLENE 6 0 C 1 30 (SUTURE) IMPLANT
SUT PROLENE 6 0 CC (SUTURE) IMPLANT
SUT SILK 0 TIES 10X30 (SUTURE) ×3 IMPLANT
SUT SILK 2 0 TIES 10X30 (SUTURE) ×6 IMPLANT
SUT SILK 2 0SH CR/8 30 (SUTURE) IMPLANT
SUT SILK 3 0 TIES 10X30 (SUTURE) ×6 IMPLANT
SUT SILK 3 0SH CR/8 30 (SUTURE) IMPLANT
SUT VIC AB 0 CT1 27 (SUTURE)
SUT VIC AB 0 CT1 27XBRD ANBCTR (SUTURE) IMPLANT
SUT VIC AB 1 CT1 27 (SUTURE)
SUT VIC AB 1 CT1 27XBRD ANBCTR (SUTURE) IMPLANT
SUT VIC AB 2-0 CT1 27 (SUTURE) ×2
SUT VIC AB 2-0 CT1 36 (SUTURE) ×3 IMPLANT
SUT VIC AB 2-0 CT1 TAPERPNT 27 (SUTURE) ×4 IMPLANT
SUT VIC AB 3-0 SH 27 (SUTURE)
SUT VIC AB 3-0 SH 27X BRD (SUTURE) IMPLANT
SUT VIC AB 3-0 X1 27 (SUTURE) ×6 IMPLANT
SYR 20CC LL (SYRINGE) IMPLANT
SYR BULB IRRIGATION 50ML (SYRINGE) ×3 IMPLANT
SYR CONTROL 10ML LL (SYRINGE) ×3 IMPLANT
SYSTEM CHEST DRAIN TLS 7FR (DRAIN) IMPLANT
TOWEL OR 17X24 6PK STRL BLUE (TOWEL DISPOSABLE) ×6 IMPLANT
TOWEL OR 17X26 10 PK STRL BLUE (TOWEL DISPOSABLE) ×6 IMPLANT
TRAY FOLEY CATH 14FRSI W/METER (CATHETERS) ×3 IMPLANT
TUBE SUCT ARGYLE STRL (TUBING) ×3 IMPLANT
WATER STERILE IRR 1000ML POUR (IV SOLUTION) ×3 IMPLANT
endoskeleton TAS 12x40x27x17 ×3 IMPLANT
endoskeleton TAS 7x40x27x12 ×3 IMPLANT
screw 6.5x25 ×3 IMPLANT

## 2012-06-28 NOTE — Interval H&P Note (Signed)
See note recorded today after exam today

## 2012-06-28 NOTE — H&P (Signed)
CC: chronic severe disabling back pain  HPI: Several year history of disabling back pain.  I performed a left sided L-S laminectomy which helped leg pain, but back pain and right leg pain ensued.  She has a degenerative scoliosis and we were attempting to avoid a large surgery with th-lumb fusion.  A spinal cord stimulator was inserted sheich helped the leg pain but she has disabling back pain.  At this point we are going to proceed with a th-lumbar fusion and today will perform a first stage lumbar fusion at L4-5 and L5-S1.  Past Hx and ROS:  Takes HRP therapy in the form of estrace.  She has had a hysterectomy, cervical fusion and choleycystectomy.  O/E: moderately obese  HEENT: cleat   CVS: reg rhythm, no murmurs, no carotic bruits  Resp:  No adventitious breath sounds   Abd:  Soft, non tender, obese  NS/MS:  Full SLR, no neuro deficits either LE.   Ass't:  Lumbar spondylosis, sp laminectomy; scoliosis  Plan: first stage anterior lumbar fusion with vascular surgery exposing.  General and specific risks discussed.

## 2012-06-28 NOTE — Progress Notes (Signed)
ANTICOAGULATION CONSULT NOTE - Initial Consult  Pharmacy Consult for Coumadin  Indication: VTE prophylaxis  Allergies  Allergen Reactions  . Sulfa Antibiotics Nausea Only    Patient Measurements: Height: 5\' 6"  (167.6 cm) (preadmit 06/17/12) Weight: 213 lb 10 oz (96.9 kg) (preadmit 06/17/12) IBW/kg (Calculated) : 59.3    Vital Signs: Temp: 98.6 F (37 C) (09/24 1445) Temp src: Oral (09/24 0640) BP: 123/66 mmHg (09/24 1445) Pulse Rate: 82  (09/24 1445)  Labs: Preadmission labs on 06/17/12: PT 13.7 INR 1.03 PTT 26 H/H 14.5/42.7 PLTC 187K SCr 0.95 K 3.7 WBC 8.1K  No results found for this basename: HGB:2,HCT:3,PLT:3,APTT:3,LABPROT:3,INR:3,HEPARINUNFRC:3,CREATININE:3,CKTOTAL:3,CKMB:3,TROPONINI:3 in the last 72 hours  Estimated Creatinine Clearance: 79.4 ml/min (by C-G formula based on Cr of 0.95).   Medical History: Past Medical History  Diagnosis Date  . Peripheral neuropathy   . Joint pain   . Joint swelling   . Scoliosis   . Psoriasis   . Spastic bladder     sporadic  . Menopause     takes Wellbutrin bid  . Arthritis     back   . Chronic lower back pain     Medications:  Prescriptions prior to admission  Medication Sig Dispense Refill  . buPROPion (WELLBUTRIN SR) 150 MG 12 hr tablet Take 150 mg by mouth 2 (two) times daily.      Marland Kitchen estradiol (ESTRACE) 1 MG tablet Take 1 mg by mouth daily.      Marland Kitchen gabapentin (NEURONTIN) 600 MG tablet Take 600-1,200 mg by mouth 3 (three) times daily. 1 tablet in the am and mid-day, 2 tablets at night.      Marland Kitchen HYDROcodone-acetaminophen (NORCO) 10-325 MG per tablet Take 1-2 tablets by mouth every 4 (four) hours as needed. Pain      . vitamin C (ASCORBIC ACID) 500 MG tablet Take 1,000 mg by mouth 2 (two) times daily.       Scheduled:    . buPROPion  150 mg Oral BID  .  ceFAZolin (ANCEF) IV  1 g Intravenous Q8H  .  ceFAZolin (ANCEF) IV  2 g Intravenous 60 min Pre-Op  . diphenhydrAMINE      . estradiol  1 mg Oral Daily  .  gabapentin  1,200 mg Oral QHS  . gabapentin  600 mg Oral Custom  . HYDROmorphone      . influenza  inactive virus vaccine  0.5 mL Intramuscular Tomorrow-1000  . morphine   Intravenous Q4H  . morphine      . ondansetron      . pantoprazole (PROTONIX) IV  40 mg Intravenous QHS  . sodium chloride  3 mL Intravenous Q12H  . vitamin C  1,000 mg Oral BID  . DISCONTD: gabapentin  600-1,200 mg Oral TID    Assessment: 56 y.o female with lumbar scoliosis, lumbar spondylosis, s/p anterior lumbar fusion -bilateral to begin Coumadin for VTE prophylaxis. Preadmit INR =1.03, PT 13.7, PTT 26, H/H 14.5/42.7 and pltc 187K.  No bleeding noted.  SCDs bilateral also ordered for VTE px.  Warfarin predictor point score= 6  Goal of Therapy:  INR 2-3 Monitor platelets by anticoagulation protocol: Yes   Plan:  Coumadin 7.5 mg po today x1  PT/INR daily Coumadin book & video ordered for patient education.  Noah Delaine, RPh Clinical Pharmacist Pager: [phone] 06/28/2012,4:05 PM

## 2012-06-28 NOTE — Anesthesia Preprocedure Evaluation (Addendum)
Anesthesia Evaluation  Patient identified by MRN, date of birth, ID band Patient awake    Reviewed: Allergy & Precautions, NPO status , Patient's Chart, lab work & pertinent test results  History of Anesthesia Complications Negative for: history of anesthetic complications  Airway Mallampati: II TM Distance: >3 FB Neck ROM: Full    Dental No notable dental hx. (+) Teeth Intact and Dental Advisory Given   Pulmonary former smoker,  breath sounds clear to auscultation  Pulmonary exam normal       Cardiovascular negative cardio ROS  Rhythm:Regular Rate:Normal     Neuro/Psych Anxiety  Neuromuscular disease (chronic back pain< narcotics daily, spinal cord stimulator) negative psych ROS   GI/Hepatic negative GI ROS, Neg liver ROS,   Endo/Other  Morbid obesity  Renal/GU negative Renal ROS     Musculoskeletal   Abdominal (+) + obese,   Peds  Hematology negative hematology ROS (+)   Anesthesia Other Findings   Reproductive/Obstetrics                          Anesthesia Physical Anesthesia Plan  ASA: II  Anesthesia Plan: General   Post-op Pain Management:    Induction: Intravenous  Airway Management Planned: Oral ETT  Additional Equipment: Arterial line  Intra-op Plan:   Post-operative Plan: Extubation in OR  Informed Consent: I have reviewed the patients History and Physical, chart, labs and discussed the procedure including the risks, benefits and alternatives for the proposed anesthesia with the patient or authorized representative who has indicated his/her understanding and acceptance.   Dental advisory given  Plan Discussed with: CRNA and Surgeon  Anesthesia Plan Comments: (Plan routine monitors, A line, GETA)        Anesthesia Quick Evaluation

## 2012-06-28 NOTE — Anesthesia Procedure Notes (Signed)
Procedure Name: Intubation Date/Time: 06/28/2012 7:57 AM Performed by: Jefm Miles E Pre-anesthesia Checklist: Patient identified, Timeout performed, Emergency Drugs available, Suction available and Patient being monitored Patient Re-evaluated:Patient Re-evaluated prior to inductionOxygen Delivery Method: Circle system utilized Preoxygenation: Pre-oxygenation with 100% oxygen Intubation Type: IV induction Ventilation: Mask ventilation without difficulty Laryngoscope Size: Mac and 3 Grade View: Grade I Tube type: Oral Tube size: 7.0 mm Number of attempts: 1 Airway Equipment and Method: Stylet Placement Confirmation: ETT inserted through vocal cords under direct vision,  breath sounds checked- equal and bilateral and positive ETCO2 Secured at: 21 cm Tube secured with: Tape Dental Injury: Teeth and Oropharynx as per pre-operative assessment

## 2012-06-28 NOTE — Op Note (Signed)
[facility]  Patient name: MERLENE DANTE MRN: [id number] DOB: 27-Aug-1956 Sex: female  06/28/2012 Pre-operative Diagnosis: Degenerative back disease Post-operative diagnosis:  Same Surgeon:  Jorge Ny Co-surgeon:  M. Alveda Reasons, M.D. Procedure:   Anterior exposure of L4-L5 5, L5-S1 Anesthesia:  Gen. Blood Loss:  See anesthesia record Specimens:  None  Findings:  Adequate exposure  Indications:  The patient was seen and evaluated preoperatively by Dr. Alveda Reasons who wishes to proceed with instrumentation of the L4-L5, L5-S1 disc space. I was asked to provide anterior exposure. I met the patient in the morning of the operation and discussed the risks associated with my portion of the procedure which include injury to the artery nerve and ureter. The patient was eager to proceed.  Procedure:  The patient was identified in the holding area and taken to [facility] OR ROOM 04  The patient was then placed supine on the table. general anesthesia was administered.  The patient was prepped and draped in the usual sterile fashion.  A time out was called and antibiotics were administered.  Fluoroscopy was used to identify the appropriate level of skin incision to correlate with the desired exposure. A longitudinal paramedian incision was made to the left and below the umbilicus. Cautery could not be utilized do to the presence of a spinal stimulator. Therefore bipolar and blunt retraction was used to expose the anterior rectus sheath. The rectus sheath was then opened with Metzenbaum scissors. The medial and of the left rectus was mobilized. A plane was developed posterior to the rectus muscle. The retroperitoneal space was identified and then entered. The peritoneal contents were then mobilized superiorly and medially. The left iliac vein and artery were identified. The ureter was identified crossing the iliac bifurcation. It was mobilized and protected medially. The iliac artery on the  left was then skeletonized and reflected medially. This exposed the vein. There was one large iliolumbar vein which was ligated between 2-0 silk ties. The vein was then mobilized further medially. Blunt dissection was used to expose the L4-L5 disc space. Once adequate exposure was achieved, attention was turned towards the L5-S1 exposure. This was exposed between the bifurcation of the vena cava. The median sacral vessels were divided with bipolar cautery. Once adequate exposure was achieved, the [facility] retractor was placed to expose the L5-S1 disc space. Malleable blades were placed laterally and medially for adequate exposure. At this point Dr. Alveda Reasons performed his portion of the procedure. Once this level was satisfactorily repaired, the retractors were moved up to expose the L4-L5 disc space. Again Dr. performed his portion of the procedure. Please see his operative note for full details. Once we were satisfied with the repair of the wound was inspected for hemostasis. There was a palpable pulse within the iliac artery. The ureter was inspected. It was without injury. The peritoneal contents were placed back into their anatomic position. The abdominal wall fascia was closed with a running #1 Vicryl suture. The subcutaneous tissue was closed with a layer of 20 and a layer of 3-0 Vicryl. 4 Vicryl was placed on the skin. I then examined the foot with hand-held Doppler. There was a biphasic signal in the posterior tibial artery which was similar to preoperative examination.  Disposition:  To PACU in stable condition.   Juleen China, M.D. [facility] Office: [phone] Pager:  [phone]

## 2012-06-28 NOTE — Transfer of Care (Signed)
Immediate Anesthesia Transfer of Care Note  Patient: [name]  Procedure(s) Performed: Procedure(s) (LRB) with comments: ANTERIOR LUMBAR FUSION 2 LEVELS (Bilateral) - 1st stage anterior interbody fusion L4-5/L5-S1 ABDOMINAL EXPOSURE (Bilateral)  Patient Location: PACU  Anesthesia Type: General  Level of Consciousness: oriented, lethargic and responds to stimulation  Airway & Oxygen Therapy: Patient Spontanous Breathing and Patient connected to nasal cannula oxygen  Post-op Assessment: Report given to PACU RN  Post vital signs: Reviewed and stable  Complications: No apparent anesthesia complications

## 2012-06-28 NOTE — Anesthesia Postprocedure Evaluation (Signed)
  Anesthesia Post-op Note  Patient: [name]  Procedure(s) Performed: Procedure(s) (LRB) with comments: ANTERIOR LUMBAR FUSION 2 LEVELS (Bilateral) - 1st stage anterior interbody fusion L4-5/L5-S1 ABDOMINAL EXPOSURE (Bilateral)  Patient Location: PACU  Anesthesia Type: General  Level of Consciousness: sedated, patient cooperative and responds to stimulation and voice  Airway and Oxygen Therapy: Patient Spontanous Breathing and Patient connected to nasal cannula oxygen  Post-op Pain: mild  Post-op Assessment: Post-op Vital signs reviewed, Patient's Cardiovascular Status Stable, Respiratory Function Stable, Patent Airway and Pain level controlled, nausea improved  Post-op Vital Signs: Reviewed and stable  Complications: No apparent anesthesia complications

## 2012-06-28 NOTE — Preoperative (Signed)
Beta Blockers   Reason not to administer Beta Blockers:Not Applicable 

## 2012-06-28 NOTE — Op Note (Signed)
Preoperative diagnosis: Lumbar scoliosis, lumbar spondylosis, status post lumbar laminectomy  Postoperative diagnosis: The same  Co-surgeon's: Dr. Nelda Severe, Dr. Durene Cal  Asst.: Amy Kendell Bane RNFA   Operative procedure: First stage anterior interbody fusions L4-5 and L5-S1 utilizing titanium cages, Bioglass (Novabone); floroscopic guidance  The patient was placed under general endotracheal anesthesia.   Intravenous prophylactic antibiotics were administered. Sequential compression devices were placed on both lower extremities. The abdomen was prepped and draped. As noted by Dr. Myra Gianotti, fluoroscopic guidance was used to determine the distal extent of these incisions necessary. Dr. Myra Gianotti exposed the L5-S1 and L4-5 interspaces. The L5-S1 level was confirmed in and with lateral fluoroscopic guidance.  After placing the retractors and L5-S1, the annulus was fenestrated in rectangular fashion, just dissected off the endplates and most of the disc removed in one piece. Further disc was removed posteriorly and laterally and the and plate cartilage curetted away. Appropriate size titanium implant was chosen based on the sizing of the trial/broach.  The implant was packed with Bioglass insured under fluoroscopic guidance. The retention screws were applied, 2 she clearly and one inferiorly. Satisfactory position was observed on AP and lateral fluoroscopic viewing.  The retractors were then removed and replaced at the L4-5 level. A similar process was carried at L4-5, all the disc was more degenerated and the segmental level, L4-5, was observed to be hypermobile. Again, an appropriately sized taken implant was packed with Bioglass and impacted into place under lateral fluoroscopic guidance. Retention screws were placed in similar fashion as described for L5-S1.   AP and lateral first copy views were taken a retained. Dr. Myra Gianotti closed the wound appeared  The blood loss was less than 200 cc. There  were no intraoperative complications.  In the recovery room the patient is in satisfactory condition. She can dorsiflex and plantar flex both feet and ankles. Both feet appear monitor circulation with good capillary filling the pedal pulses are not palpable preoperatively, but were dopplerable. Dr. Myra Gianotti dopplered the left pedal pulses prior were leaving the operating room.

## 2012-06-29 ENCOUNTER — Encounter (HOSPITAL_COMMUNITY): Payer: Self-pay | Admitting: Orthopedic Surgery

## 2012-06-29 LAB — BASIC METABOLIC PANEL
BUN: 8 mg/dL (ref 6–23)
CO2: 28 mEq/L (ref 19–32)
Calcium: 8.7 mg/dL (ref 8.4–10.5)
Creatinine, Ser: 0.79 mg/dL (ref 0.50–1.10)
GFR calc non Af Amer: >90 mL/min (ref >90)
Glucose, Bld: 98 mg/dL (ref 70–99)
Sodium: 139 mEq/L (ref 135–145)

## 2012-06-29 LAB — CBC
HCT: 36.8 % (ref 36.0–46.0)
Hemoglobin: 12.2 g/dL (ref 12.0–15.0)
MCH: 31 pg (ref 26.0–34.0)
MCHC: 33.2 g/dL (ref 30.0–36.0)
MCV: 93.6 fL (ref 78.0–100.0)
RBC: 3.93 MIL/uL (ref 3.87–5.11)

## 2012-06-29 LAB — PROTIME-INR: INR: 1.03 (ref 0.00–1.49)

## 2012-06-29 MED ORDER — BUPROPION HCL ER (SR) 150 MG PO TB12: 150.0000 mg | ORAL_TABLET | Freq: Every day | ORAL | Status: DC

## 2012-06-29 MED ORDER — VITAMIN C 500 MG PO TABS
1000.0000 mg | ORAL_TABLET | Freq: Every day | ORAL | Status: DC
  Administered 2012-06-29 – 2012-06-30 (×2): 1000 mg via ORAL
  Filled 2012-06-29 (×3): qty 2

## 2012-06-29 MED ORDER — WARFARIN SODIUM 7.5 MG PO TABS
7.5000 mg | ORAL_TABLET | Freq: Once | ORAL | Status: AC
  Administered 2012-06-29: 7.5 mg via ORAL
  Filled 2012-06-29: qty 1

## 2012-06-29 MED ORDER — BUPROPION HCL ER (SR) 150 MG PO TB12
150.0000 mg | ORAL_TABLET | Freq: Every day | ORAL | Status: DC
  Administered 2012-06-29 – 2012-06-30 (×2): 150 mg via ORAL
  Filled 2012-06-29 (×3): qty 1

## 2012-06-29 MED ORDER — VITAMIN C 500 MG PO TABS: 1000.0000 mg | ORAL_TABLET | Freq: Every day | ORAL | Status: DC

## 2012-06-29 NOTE — Progress Notes (Signed)
ANTICOAGULATION CONSULT NOTE - Initial Consult  Pharmacy Consult for Coumadin  Indication: VTE prophylaxis  Allergies  Allergen Reactions  . Sulfa Antibiotics Nausea Only    Patient Measurements: Height: 5\' 6"  (167.6 cm) (preadmit 06/17/12) Weight: 213 lb 10 oz (96.9 kg) (preadmit 06/17/12) IBW/kg (Calculated) : 59.3    Vital Signs: Temp: 99 F (37.2 C) (09/25 0639) BP: 95/55 mmHg (09/25 0639) Pulse Rate: 84  (09/25 0639)  Labs: Preadmission labs on 06/17/12: PT 13.7 INR 1.03 PTT 26 H/H 14.5/42.7 PLTC 187K SCr 0.95 K 3.7 WBC 8.1K   Basename 06/29/12 0500  HGB 12.2  HCT 36.8  PLT 159  APTT --  LABPROT 13.4  INR 1.03  HEPARINUNFRC --  CREATININE 0.79  CKTOTAL --  CKMB --  TROPONINI --    Estimated Creatinine Clearance: 94.3 ml/min (by C-G formula based on Cr of 0.79).   Medical History: Past Medical History  Diagnosis Date  . Peripheral neuropathy   . Joint pain   . Joint swelling   . Scoliosis   . Psoriasis   . Spastic bladder     sporadic  . Menopause     takes Wellbutrin bid  . Arthritis     back   . Chronic lower back pain     Medications:  Prescriptions prior to admission  Medication Sig Dispense Refill  . buPROPion (WELLBUTRIN SR) 150 MG 12 hr tablet Take 150 mg by mouth 2 (two) times daily.      Marland Kitchen estradiol (ESTRACE) 1 MG tablet Take 1 mg by mouth daily.      Marland Kitchen gabapentin (NEURONTIN) 600 MG tablet Take 600-1,200 mg by mouth 3 (three) times daily. 1 tablet in the am and mid-day, 2 tablets at night.      Marland Kitchen HYDROcodone-acetaminophen (NORCO) 10-325 MG per tablet Take 1-2 tablets by mouth every 4 (four) hours as needed. Pain      . vitamin C (ASCORBIC ACID) 500 MG tablet Take 1,000 mg by mouth 2 (two) times daily.       Scheduled:     . buPROPion  150 mg Oral BID  .  ceFAZolin (ANCEF) IV  1 g Intravenous Q8H  . diphenhydrAMINE      . estradiol  1 mg Oral Daily  . gabapentin  1,200 mg Oral QHS  . gabapentin  600 mg Oral Custom  .  HYDROmorphone      . influenza  inactive virus vaccine  0.5 mL Intramuscular Tomorrow-1000  . morphine   Intravenous Q4H  . morphine      . ondansetron      . pantoprazole (PROTONIX) IV  40 mg Intravenous QHS  . sodium chloride  3 mL Intravenous Q12H  . vitamin C  1,000 mg Oral BID  . warfarin  7.5 mg Oral ONCE-1800  . Warfarin - Pharmacist Dosing Inpatient   Does not apply q1800  . DISCONTD: gabapentin  600-1,200 mg Oral TID    Assessment: Today the INR = 1.03 in this 56 y.o female on coumadin for VTE prophylaxis s/p anterior lumbar fusion. H/o  lumbar scoliosis, lumbar spondylosis. HGB 12.2,  pltc 159K.  No bleeding noted.  SCDs bilateral also ordered for VTE px.  Warfarin predictor point score= 6  Goal of Therapy:  INR 2-3 Monitor platelets by anticoagulation protocol: Yes   Plan:  Coumadin 7.5 mg po today x1  PT/INR daily Educate patient about coumadin prior to discharge.   Noah Delaine, RPh Clinical Pharmacist Pager: [phone] 06/29/2012,10:35 AM

## 2012-06-29 NOTE — Progress Notes (Signed)
Utilization review completed. Margarie Mcguirt, RN, BSN. 

## 2012-06-29 NOTE — Progress Notes (Signed)
I agree with the following treatment note after reviewing documentation.   Johnston, Tabias Swayze Brynn   OTR/L Pager: [phone] Office: [phone] .   

## 2012-06-29 NOTE — Progress Notes (Signed)
Seen in room 5N17 this afternoon  Afebrile vital signs stable  S: almost complete relief of back pain post op, only has abdominal incisional pain, passing flatus  O: moves LE's well  A:  Good relief of back pain which is not entirely surprising in view of the gross instability at L4-5 observed introperatively  P: continue anticoagulation an PT, probable discharge Friday

## 2012-06-29 NOTE — Progress Notes (Signed)
Occupational Therapy Evaluation Patient Details Name: [name] MRN: [id number] DOB: 07-21-1956 Today's Date: 06/29/2012 Time: 6213-0865 OT Time Calculation (min): 37 min  OT Assessment / Plan / Recommendation Clinical Impression  Pt. 56 yo female s/p anterior lumbar fusion of L4-L5 and L5-S1. Pt could benefit from OT acutely to educate on AE for increased independence with ADL's    OT Assessment  Patient needs continued OT Services    Follow Up Recommendations  No OT follow up    Barriers to Discharge      Equipment Recommendations  None recommended by OT    Recommendations for Other Services    Frequency  Min 2X/week    Precautions / Restrictions Precautions Precautions: Back Precaution Booklet Issued: Yes (comment) Restrictions Weight Bearing Restrictions: No   Pertinent Vitals/Pain     ADL  Grooming: Performed;Wash/dry hands;Teeth care;Min guard Where Assessed - Grooming: Supported standing Toilet Transfer: Performed;Min Pension scheme manager Method: Sit to Barista: Raised toilet seat with arms (or 3-in-1 over toilet) Toileting - Clothing Manipulation and Hygiene: Performed;Independent Where Assessed - Toileting Clothing Manipulation and Hygiene: Sit on 3-in-1 or toilet Equipment Used: Gait belt;Cane Transfers/Ambulation Related to ADLs: Pt. refused to use RW to ambulate in room, pushing it to the side while walking to the restroom.  ADL Comments: Pt. educated on back precautions, stating she has been following them for years now before the surgery. Pt. completed toothbrushing standing at sink level and able to recall not to bend when brushing teeth at sink. Pt. required verbal cues to keep back straight during transfers.     OT Diagnosis: Acute pain  OT Problem List: Decreased safety awareness;Decreased knowledge of use of DME or AE;Decreased knowledge of precautions;Pain OT Treatment Interventions: Self-care/ADL training;DME and/or AE  instruction;Therapeutic exercise;Patient/family education;Therapeutic activities   OT Goals Acute Rehab OT Goals OT Goal Formulation: With patient Time For Goal Achievement: 07/12/12 Potential to Achieve Goals: Good ADL Goals Pt Will Perform Lower Body Dressing: with modified independence;Sit to stand from chair;with adaptive equipment ADL Goal: Lower Body Dressing - Progress: Goal set today Pt Will Transfer to Toilet: with supervision;3-in-1 ADL Goal: Toilet Transfer - Progress: Goal set today Pt Will Perform Tub/Shower Transfer: Shower transfer;with modified independence;Ambulation ADL Goal: Tub/Shower Transfer - Progress: Goal set today Miscellaneous OT Goals Miscellaneous OT Goal #1: Pt. will recall 3/3 back precautions 100% in order to increase independence with ADL's OT Goal: Miscellaneous Goal #1 - Progress: Goal set today  Visit Information  Last OT Received On: 06/29/12 Assistance Needed: +1 PT/OT Co-Evaluation/Treatment: Yes    Subjective Data  Subjective: I've been following those precautions for years now   Prior Functioning     Home Living Lives With: Spouse Available Help at Discharge: Family Type of Home: House Home Access: Stairs to enter Secretary/administrator of Steps: 4 Entrance Stairs-Rails: Left Home Layout: One level Bathroom Shower/Tub: Health visitor: Handicapped height Bathroom Accessibility: Yes How Accessible: Accessible via walker Home Adaptive Equipment: Straight cane;Grab bars in shower;Walker - four wheeled Prior Function Level of Independence: Independent with assistive device(s) Able to Take Stairs?: Yes Driving: Yes Vocation: On disability Communication Communication: No difficulties Dominant Hand: Right         Vision/Perception     Cognition  Overall Cognitive Status: Appears within functional limits for tasks assessed/performed Arousal/Alertness: Awake/alert Orientation Level: Oriented X4 /  Intact Behavior During Session: [facility] for tasks performed    Extremity/Trunk Assessment       Mobility  Bed Mobility Bed Mobility: Supine to Sit;Sitting - Scoot to Edge of Bed Supine to Sit: 4: Min assist;HOB elevated Sitting - Scoot to Edge of Bed: 5: Supervision Details for Bed Mobility Assistance: Pt. required min (A) to sit upright in bed.  Transfers Transfers: Sit to Stand;Stand to Sit Sit to Stand: 4: Min guard;With upper extremity assist;From bed;From chair/3-in-1 Stand to Sit: 4: Min guard;With upper extremity assist;To chair/3-in-1;With armrests Details for Transfer Assistance: Cues for safest hand placement and to keep back straight                     End of Session OT - End of Session Equipment Utilized During Treatment: Gait belt Activity Tolerance: Patient tolerated treatment well Patient left: in chair;with call bell/phone within reach;with nursing in room Nurse Communication: Mobility status;Precautions  GO     [name] 06/29/2012, 1:01 PM

## 2012-06-29 NOTE — Progress Notes (Signed)
Agree with PT evaluation.  Katerine Morua, PT DPT [phone]  

## 2012-06-29 NOTE — Progress Notes (Signed)
Physical Therapy Evaluation Note:   06/29/12 1300  PT Visit Information  Assistance Needed +1  PT/OT Co-Evaluation/Treatment Yes  Subjective Data  Subjective Feeling okay.  Earlier today I was left on EOB for over an hour by nursing staff  Patient Stated Goal To go home  Precautions  Precautions Back  Precaution Booklet Issued Yes (comment)  Restrictions  Weight Bearing Restrictions No  Home Living  Lives With Spouse  Available Help at Discharge Family  Type of Home House  Home Access Stairs to enter  Entrance Stairs-Number of Steps 4  Entrance Stairs-Rails Left  Home Layout One level  Bathroom Shower/Tub Walk-in shower  Bathroom Toilet Handicapped height  Bathroom Accessibility Yes  How Accessible Accessible via walker  Home Adaptive Equipment Straight cane;Grab bars in shower;Walker - four wheeled  Prior Function  Level of Independence Independent with assistive device(s)  Able to Take Stairs? Yes  Driving Yes  Vocation On disability  Communication  Communication No difficulties  Cognition  Overall Cognitive Status Appears within functional limits for tasks assessed/performed  Arousal/Alertness Awake/alert  Orientation Level Oriented X4 / Intact  Behavior During Session [facility] for tasks performed  Bed Mobility  Bed Mobility Supine to Sit;Sitting - Scoot to Edge of Bed  Supine to Sit 4: Min guard  Sitting - Scoot to [place] of Bed 4: Min guard  Details for Bed Mobility Assistance Cues for proper technique and safety precautions  Transfers  Transfers Sit to Stand;Stand to Sit  Sit to Stand 4: Min guard;With upper extremity assist;From bed;From toilet  Stand to Sit 4: Min guard;With upper extremity assist;To chair/3-in-1;To toilet  Details for Transfer Assistance Cues for proper technique and safety  Ambulation/Gait  Ambulation/Gait Assistance 4: Min assist  Ambulation Distance (Feet) 20 Feet  Assistive device Rolling walker  Ambulation/Gait Assistance Details (A) with  weight bearing.  Pt impulsive and refused to use walker during ambulation.  She used my hand as support and began to grab onto other objects while walking.  Walker was left in the room and pt still verbalizes refusal.  During the second part of ambulation pt used a SPC to ambulate and improved gait quality but still unsteady.  Gait Pattern Step-through pattern  Gait velocity decreased  Stairs No  PT - End of Session  Equipment Utilized During Treatment Gait belt  Activity Tolerance Patient tolerated treatment well  Patient left in chair;with call bell/phone within reach;with nursing in room  Nurse Communication Mobility status  PT Assessment  Clinical Impression Statement Pt is a 56 y.o. F s/p lumbar fusion.  Pt can be impulsive and expressed not wanting to utilize the walker during ambulation.  Pt was educated on precautions and how the RW would benefit rehab and pt still refused.  Pt agreeable to use of cane during session but is unsteady. Pt will benefit from acute physical therapy services to increase gait quality, endurance, balance, and safety.  PT Recommendation/Assessment Patient needs continued PT services  PT Problem List Decreased strength;Decreased range of motion;Decreased activity tolerance;Decreased balance;Decreased mobility;Decreased knowledge of use of DME;Decreased safety awareness;Pain  Barriers to Discharge None  PT Therapy Diagnosis  Difficulty walking;Abnormality of gait;Generalized weakness;Acute pain  PT Plan  PT Frequency Min 5X/week  PT Treatment/Interventions DME instruction;Gait training;Stair training;Functional mobility training;Therapeutic activities;Therapeutic exercise;Balance training;Neuromuscular re-education  PT Recommendation  Recommendations for Other Services Other (comment) (None)  Follow Up Recommendations Home health PT  Equipment Recommended None recommended by PT  Individuals Consulted  Consulted and Agree with Results and  Recommendations Patient    Acute Rehab PT Goals  PT Goal Formulation With patient  Time For Goal Achievement 06/29/12  Potential to Achieve Goals Good  Pt will go Supine/Side to Sit with modified independence;with HOB 0 degrees  PT Goal: Supine/Side to Sit - Progress Goal set today  Pt will go Sit to Supine/Side with modified independence;with HOB 0 degrees  PT Goal: Sit to Supine/Side - Progress Goal set today  Pt will go Sit to Stand with modified independence;with upper extremity assist  PT Goal: Sit to Stand - Progress Goal set today  Pt will go Stand to Sit with modified independence;with upper extremity assist  PT Goal: Stand to Sit - Progress Goal set today  Pt will Ambulate >150 feet;with modified independence;with least restrictive assistive device  PT Goal: Ambulate - Progress Goal set today  Pt will Go Up / Down Stairs 3-5 stairs;with supervision;with least restrictive assistive device  PT Goal: Up/Down Stairs - Progress Goal set today  Written Expression  Dominant Hand Right     4/10 pain in anterior region of abdomen.  Prithvi Kooi, SPT

## 2012-06-29 NOTE — Progress Notes (Signed)
[facility] [place]  Subjective  - POD #1, status post anterior exposure  Complaining of abdominal pain today. No flatus. Legs feel the vas to ever have   Physical Exam:  Abdomen is soft. Mildly tender. Dressing is dry Brisk Doppler signal in the left posterior tibial and dorsalis pedis    Assessment/Plan:  POD #1  From a vascular perspective she is doing well. We will continue to monitor her abdomen. She appears to have a mild postoperative ileus at this time. We will advance diet slowly. Other disposition per Dr. Jerene Canny IV, V. Anner Crete 06/29/2012 8:29 AM --  Ceasar Mons Vitals:   06/29/12 0800  BP:   Pulse:   Temp:   Resp: 16    Intake/Output Summary (Last 24 hours) at 06/29/12 0829 Last data filed at 06/29/12 0659  Gross per 24 hour  Intake   4660 ml  Output   4060 ml  Net    600 ml     Laboratory CBC    Component Value Date/Time   WBC 7.7 06/29/2012 0500   HGB 12.2 06/29/2012 0500   HCT 36.8 06/29/2012 0500   PLT 159 06/29/2012 0500    BMET    Component Value Date/Time   NA 139 06/29/2012 0500   K 3.8 06/29/2012 0500   CL 102 06/29/2012 0500   CO2 28 06/29/2012 0500   GLUCOSE 98 06/29/2012 0500   BUN 8 06/29/2012 0500   CREATININE 0.79 06/29/2012 0500   CALCIUM 8.7 06/29/2012 0500   GFRNONAA >90 06/29/2012 0500   GFRAA >90 06/29/2012 0500    COAG Lab Results  Component Value Date   INR 1.03 06/17/2012   INR 0.98 07/03/2011   INR 0.91 11/07/2010   No results found for this basename: PTT    Antibiotics Anti-infectives     Start     Dose/Rate Route Frequency Ordered Stop   06/28/12 1445   ceFAZolin (ANCEF) IVPB 1 g/50 mL premix        1 g 100 mL/hr over 30 Minutes Intravenous Every 8 hours 06/28/12 1435 06/29/12 0031   06/28/12 0850   polymyxin B 500,000 Units, bacitracin 50,000 Units in sodium chloride irrigation 0.9 % 500 mL irrigation  Status:  Discontinued          As needed 06/28/12 0850 06/28/12 1127   06/27/12 1435    ceFAZolin (ANCEF) IVPB 2 g/50 mL premix        2 g 100 mL/hr over 30 Minutes Intravenous 60 min pre-op 06/27/12 1435 06/28/12 0805           V. Charlena Cross, M.D. [facility] [place] Office: [phone] Pager:  [phone]

## 2012-06-30 MED ORDER — WARFARIN SODIUM 7.5 MG PO TABS
7.5000 mg | ORAL_TABLET | Freq: Once | ORAL | Status: AC
  Administered 2012-06-30: 7.5 mg via ORAL
  Filled 2012-06-30: qty 1

## 2012-06-30 MED FILL — Heparin Sodium (Porcine) Inj 1000 Unit/ML: INTRAMUSCULAR | Qty: 30 | Status: AC

## 2012-06-30 MED FILL — Sodium Chloride IV Soln 0.9%: INTRAVENOUS | Qty: 1000 | Status: AC

## 2012-06-30 NOTE — Progress Notes (Signed)
I agree with the following treatment note after reviewing documentation.   Johnston, [place] Hoback Brynn   OTR/L Pager: [phone] Office: [phone] .   

## 2012-06-30 NOTE — Progress Notes (Signed)
Occupational Therapy Discharge Patient Details Name: [name] MRN: [id number] DOB: April 13, 1956 Today's Date: 06/30/2012 Time: 4010-2725 OT Time Calculation (min): 23 min  Patient discharged from OT services secondary to goals met and no further OT needs identified.  Please see latest therapy progress note for current level of functioning and progress toward goals.    Progress and discharge plan discussed with patient and/or caregiver: Patient/Caregiver agrees with plan  GO     [name] 06/30/2012, 11:00 AM

## 2012-06-30 NOTE — Progress Notes (Signed)
Agree with PT Treatment Note.  [place], Pray DPT [phone]

## 2012-06-30 NOTE — Progress Notes (Signed)
ANTICOAGULATION CONSULT - COUMADIN  Pharmacy Consult for Coumadin  HPI: 56 y.o.female admitted for lumbar spondylosis L4-5/L5-S1 s/p lumbar laminectomy who is currently on Coumadin for VTE prophylaxis   Allergies: Allergies  Allergen Reactions  . Sulfa Antibiotics Nausea Only    Height/Weight: Height: 5\' 6"  (167.6 cm) (preadmit 06/17/12) Weight: 213 lb 10 oz (96.9 kg) (preadmit 06/17/12) IBW/kg (Calculated) : 59.3   Vitals: Blood pressure 117/55, pulse 83, temperature 98.2 F (36.8 C), temperature source Oral, resp. rate 16, height 5\' 6"  (1.676 m), weight 213 lb 10 oz (96.9 kg), SpO2 97.00%.  Current active problems: Active Problems:  * No active hospital problems. *    Medical / Surgical History: Past Medical History  Diagnosis Date  . Peripheral neuropathy   . Joint pain   . Joint swelling   . Scoliosis   . Psoriasis   . Spastic bladder     sporadic  . Menopause     takes Wellbutrin bid  . Arthritis     back   . Chronic lower back pain    Past Surgical History  Procedure Date  . Gum surgery 06/2012    "created flap for upper bridge, all across front"  . Laminectomy 11/2010  . Colonoscopy   . Esophagogastroduodenoscopy   . Anterior lumbar fusion 89/24/2013    "cages"  . Anterior fusion cervical spine 2000  . Back surgery   . Refractive surgery ~ 2010    bilaterally  . Spinal cord stimulator insertion 07/2011  . Cholecystectomy 1990's  . Abdominal hysterectomy 1990's  . Appendectomy 1990's    "think they did when they did hysterectomy"  . Anterior lumbar fusion 06/28/2012    Procedure: ANTERIOR LUMBAR FUSION 2 LEVELS;  Surgeon: Mat Carne, MD;  Location: [facility] OR;  Service: Orthopedics;  Laterality: Bilateral;  1st stage anterior interbody fusion L4-5/L5-S1    Current Labs:   Basename 06/30/12 0525 06/29/12 0500  HGB -- 12.2  HCT -- 36.8  PLT -- 159  LABPROT 17.1* 13.4  INR 1.43 1.03  CREATININE -- 0.79   Lab Results  Component Value  Date   INR 1.43 06/30/2012   INR 1.03 06/29/2012   INR 1.03 06/17/2012   Estimated Creatinine Clearance: 94.3 ml/min (by C-G formula based on Cr of 0.79).  Pertinent Medication History: Medication Sig  . buPROPion (WELLBUTRIN SR) 150 MG 12 hr tablet Take 150 mg by mouth at bedtime.  Marland Kitchen estradiol (ESTRACE) 1 MG tablet Take 1 mg by mouth daily.  Marland Kitchen gabapentin (NEURONTIN) 600 MG tablet Take 600-1,200 mg by mouth 3 (three) times daily. 1 tablet in the am and mid-day, 2 tablets at night.  Marland Kitchen HYDROcodone-acetaminophen (NORCO) 10-325 MG per tablet Take 1-2 tablets by mouth every 4 (four) hours as needed. Pain  . vitamin C (ASCORBIC ACID) 500 MG tablet Take 1,000 mg by mouth at bedtime.   Scheduled:    . buPROPion  150 mg Oral QHS  . estradiol  1 mg Oral Daily  . gabapentin  1,200 mg Oral QHS  . gabapentin  600 mg Oral Custom  . influenza  inactive virus vaccine  0.5 mL Intramuscular Tomorrow-1000  . pantoprazole (PROTONIX) IV  40 mg Intravenous QHS  . sodium chloride  3 mL Intravenous Q12H  . vitamin C  1,000 mg Oral QHS  . warfarin  7.5 mg Oral ONCE-1800  . Warfarin - Pharmacist Dosing Inpatient   Does not apply q1800    Assessment:  56 y.o.female admitted for  lumbar spondylosis L4-5/L5-S1 s/p lumbar laminectomy who is currently on Coumadin for VTE prophylaxis   Today the INR up from baseline following 2 doses of Coumadin 7.5 mg  No complications noted..  Goals:  Target INR of 2-3.  Plan:  Will Repeat Coumadin 7.5 mg.    Daily INR's, CBC.  [name], Elisha Headland, Pharm. D. 06/30/2012, 9:14 AM

## 2012-06-30 NOTE — Progress Notes (Signed)
Occupational Therapy Treatment Patient Details Name: [name] MRN: [id number] DOB: 08/21/1956 Today's Date: 06/30/2012 Time: 4098-1191 OT Time Calculation (min): 23 min  OT Assessment / Plan / Recommendation Comments on Treatment Session Pt. very cooperative during session and able to verbally recall 3/3 back precautions. Still requires min verbal cueing not to break precations during ADL tasks.     Follow Up Recommendations  No OT follow up    Barriers to Discharge       Equipment Recommendations  None recommended by OT    Recommendations for Other Services    Frequency Min 2X/week   Plan All goals met and education completed, patient discharged from OT services    Precautions / Restrictions Precautions Precautions: Back Restrictions Weight Bearing Restrictions: No   Pertinent Vitals/Pain None reported    ADL  Grooming: Performed;Wash/dry hands;Supervision/safety Where Assessed - Grooming: Unsupported standing Lower Body Bathing: Simulated;Modified independent Where Assessed - Lower Body Bathing: Unsupported sitting Lower Body Dressing: Performed;Modified independent Where Assessed - Lower Body Dressing: Unsupported sitting;Supine, head of bed up (demonstrated proper techniques with AE and in supine) Toilet Transfer: Performed;Modified independent Toilet Transfer Method: Sit to Barista: Regular height toilet;Grab bars Toileting - Clothing Manipulation and Hygiene: Performed;Independent Where Assessed - Toileting Clothing Manipulation and Hygiene: Sit on 3-in-1 or toilet Tub/Shower Transfer: Simulated;Modified independent Tub/Shower Transfer Method: Science writer: Walk in shower Equipment Used: Gait belt;Rolling walker Transfers/Ambulation Related to ADLs: Pt. ambulates using RW requiring cues for safety. Pt. ambulated in room with supervision for safety  ADL Comments: Pt. able to recall 3/3 back precautions. Pt.  educated on AE (reacher, long handled sponge, sock aid) for use with LE bathing and dressing. Pt. also able to demonstrate dressing LE in supine on bed. Pt. completed toilet transfer with use of RW and grab bars. Pt. required minimal verbal cues throughout ADL tasks to maintain back precautions    OT Diagnosis:    OT Problem List:   OT Treatment Interventions:     OT Goals Acute Rehab OT Goals OT Goal Formulation: With patient Time For Goal Achievement: 07/12/12 Potential to Achieve Goals: Good ADL Goals Pt Will Perform Lower Body Dressing: with modified independence;Sit to stand from chair;with adaptive equipment ADL Goal: Lower Body Dressing - Progress: Met Pt Will Transfer to Toilet: with supervision;3-in-1 ADL Goal: Toilet Transfer - Progress: Met Pt Will Perform Tub/Shower Transfer: Shower transfer;with modified independence;Ambulation ADL Goal: Tub/Shower Transfer - Progress: Met Miscellaneous OT Goals Miscellaneous OT Goal #1: Pt. will recall 3/3 back precautions 100% in order to increase independence with ADL's OT Goal: Miscellaneous Goal #1 - Progress: Met  Visit Information  Last OT Received On: 06/30/12 Assistance Needed: +1    Subjective Data      Prior Functioning       Cognition  Overall Cognitive Status: Appears within functional limits for tasks assessed/performed Arousal/Alertness: Awake/alert Orientation Level: Appears intact for tasks assessed Behavior During Session: [facility] for tasks performed    Mobility  Bed Mobility Bed Mobility: Supine to Sit;Sitting - Scoot to Edge of Bed Supine to Sit: 5: Supervision;HOB flat Sitting - Scoot to Edge of Bed: 5: Supervision Details for Bed Mobility Assistance: Supervision for safety Transfers Transfers: Sit to Stand;Stand to Sit Sit to Stand: 5: Supervision;With upper extremity assist;From bed;From toilet Stand to Sit: 5: Supervision;With upper extremity assist;To bed;To toilet Details for Transfer Assistance:  Supervision for safety  End of Session OT - End of Session Equipment Utilized During Treatment: Gait belt Activity Tolerance: Patient tolerated treatment well Patient left: in bed;with call bell/phone within reach  GO     [name] 06/30/2012, 10:59 AM

## 2012-06-30 NOTE — Progress Notes (Signed)
Referral received for SNF. Chart reviewed and CSW has spoken with RNCM who indicates that patient is for DC to home with Home Health and DME.  CSW to sign off. Please re-consult if CSW needs arise.  Icey Tello T. Nelani Schmelzle, LCSWA  [phone]  

## 2012-06-30 NOTE — Progress Notes (Signed)
Seen  In room 5N17 this evening  Afebrile, vital signs stable  S: Very little pain   O: dressing dry, sitting on edge of bed eating supper  A: good progress  P: home tomorrow on coumadin with arrangements for blood draws for INR

## 2012-06-30 NOTE — Progress Notes (Signed)
Physical Therapy Treatment Patient Details Name: [name] MRN: [id number] DOB: January 09, 1956 Today's Date: 06/30/2012 Time: 6301-6010 PT Time Calculation (min): 23 min  PT Assessment / Plan / Recommendation Comments on Treatment Session  Pt increased ambulation distance and posture during therapy session.  Pt reported performing stair negotiation sideways with prior surgery.  Pt was able to perform stair negotiation sideways with supervision for safety.  Pt educated on precautions and was able to recall 2/3 precautions.      Follow Up Recommendations  Home health PT    Barriers to Discharge        Equipment Recommendations  None recommended by PT    Recommendations for Other Services Other (comment) (None)  Frequency Min 5X/week   Plan Discharge plan remains appropriate;Frequency remains appropriate    Precautions / Restrictions Precautions Precautions: Back Restrictions Weight Bearing Restrictions: No   Pertinent Vitals/Pain Pt reports that pain after surgery is better than the pain she was experiencing before surgery.      Mobility  Bed Mobility Bed Mobility: Supine to Sit;Sitting - Scoot to Edge of Bed Supine to Sit: 5: Supervision;HOB flat Sitting - Scoot to Edge of Bed: 5: Supervision Details for Bed Mobility Assistance: Supervision for safety Transfers Transfers: Sit to Stand;Stand to Sit Sit to Stand: 5: Supervision;With upper extremity assist;From bed;From toilet Stand to Sit: 5: Supervision;With upper extremity assist;To chair/3-in-1;To toilet Details for Transfer Assistance: Supervision for safety Ambulation/Gait Ambulation/Gait Assistance: 5: Supervision Ambulation Distance (Feet): 200 Feet Assistive device: Rolling walker Ambulation/Gait Assistance Details: Superivion for safety Gait Pattern: Step-through pattern Gait velocity: decreased Stairs: Yes Stairs Assistance: 5: Supervision Stairs Assistance Details (indicate cue type and reason): Supervision  for safety Stair Management Technique: One rail Left;Sideways Number of Stairs: 4  Wheelchair Mobility Wheelchair Mobility: No    Exercises     PT Diagnosis:    PT Problem List:   PT Treatment Interventions:     PT Goals Acute Rehab PT Goals PT Goal Formulation: With patient Time For Goal Achievement: 06/29/12 Potential to Achieve Goals: Good Pt will go Supine/Side to Sit: with modified independence;with HOB 0 degrees PT Goal: Supine/Side to Sit - Progress: Progressing toward goal Pt will go Sit to Stand: with modified independence;with upper extremity assist PT Goal: Sit to Stand - Progress: Progressing toward goal Pt will go Stand to Sit: with modified independence;with upper extremity assist PT Goal: Stand to Sit - Progress: Progressing toward goal Pt will Ambulate: >150 feet;with modified independence;with least restrictive assistive device PT Goal: Ambulate - Progress: Met Pt will Go Up / Down Stairs: 3-5 stairs;with supervision;with least restrictive assistive device PT Goal: Up/Down Stairs - Progress: Met  Visit Information  Last PT Received On: 06/30/12 Assistance Needed: +1    Subjective Data  Subjective: Feeling okay need to use the restroom Patient Stated Goal: To go home tomorrow   Cognition  Overall Cognitive Status: Appears within functional limits for tasks assessed/performed Arousal/Alertness: Awake/alert Orientation Level: Appears intact for tasks assessed Behavior During Session: [facility] for tasks performed    Balance     End of Session PT - End of Session Equipment Utilized During Treatment: Gait belt Activity Tolerance: Patient tolerated treatment well Patient left: in chair;with call bell/phone within reach Nurse Communication: Mobility status   GP     [name] 06/30/2012, 8:51 AM

## 2012-06-30 NOTE — Progress Notes (Signed)
I agree with the following treatment note after reviewing documentation.   Johnston, Kaeo Jacome Brynn   OTR/L Pager: [phone] Office: [phone] .   

## 2012-07-01 LAB — PROTIME-INR
INR: 1.95 — ABNORMAL HIGH (ref 0.00–1.49)
Prothrombin Time: 21.5 seconds — ABNORMAL HIGH (ref 11.6–15.2)

## 2012-07-01 MED ORDER — WARFARIN - PHARMACIST DOSING INPATIENT: Status: DC

## 2012-07-01 NOTE — Progress Notes (Signed)
Physical Therapy Treatment Patient Details Name: [name] MRN: [id number] DOB: Oct 14, 1955 Today's Date: 07/01/2012 Time: 9562-1308 PT Time Calculation (min): 23 min  PT Assessment / Plan / Recommendation Comments on Treatment Session  Pt increased gait quality, ambulation distance, and posture during therapy session.  Pt educated on walking exercises to perform at home and to take short breaks on the drive home every hour.    Follow Up Recommendations  Home health PT    Barriers to Discharge        Equipment Recommendations  None recommended by PT    Recommendations for Other Services Other (comment) (none)  Frequency Min 5X/week   Plan Discharge plan remains appropriate;Frequency remains appropriate    Precautions / Restrictions Precautions Precautions: Back Restrictions Weight Bearing Restrictions: No   Pertinent Vitals/Pain Pain is worse during coughs.  Did not rate pain.    Mobility  Bed Mobility Bed Mobility: Not assessed Transfers Transfers: Not assessed Ambulation/Gait Ambulation/Gait Assistance: 5: Supervision Ambulation Distance (Feet): 300 Feet Assistive device: Rolling walker Ambulation/Gait Assistance Details: Supervision for safety Gait Pattern: Step-through pattern Gait velocity: decreased General Gait Details: ' Stairs: No Wheelchair Mobility Wheelchair Mobility: No    Exercises     PT Diagnosis:    PT Problem List:   PT Treatment Interventions:     PT Goals Acute Rehab PT Goals PT Goal Formulation: With patient Time For Goal Achievement: 06/29/12 Potential to Achieve Goals: Good Pt will Ambulate: >150 feet;with modified independence;with least restrictive assistive device PT Goal: Ambulate - Progress: Met  Visit Information  Last PT Received On: 07/01/12 Assistance Needed: +1    Subjective Data  Subjective: Ready to go home. Patient Stated Goal: To go home    Cognition  Overall Cognitive Status: Appears within functional limits  for tasks assessed/performed Arousal/Alertness: Awake/alert Orientation Level: Appears intact for tasks assessed Behavior During Session: [facility] for tasks performed    Balance     End of Session PT - End of Session Activity Tolerance: Patient tolerated treatment well Patient left: with family/visitor present;Other (comment) (Pt did not want to sit down due to being d/c shortly) Nurse Communication: Mobility status   GP     Gaines Cartmell 07/01/2012, 10:10 AM

## 2012-07-01 NOTE — Progress Notes (Signed)
Agree with PT treatment note.  Yaqueline Gutter, PT DPT [phone]  

## 2012-07-01 NOTE — Care Management Note (Addendum)
    Page 1 of 2   07/04/2012     12:57:31 PM   CARE MANAGEMENT NOTE 07/04/2012  Patient:  YULISA, CHIRICO   Account Number:  [id number]  Date Initiated:  07/01/2012  Documentation initiated by:  Anette Guarneri  Subjective/Objective Assessment:   POD#3 ALIF  [facility] services needed  no DME needs     Action/Plan:   home with [facility] services   Anticipated DC Date:  07/01/2012   Anticipated DC Plan:  HOME W HOME HEALTH SERVICES      DC Planning Services  CM consult      [facility] Choice  HOME HEALTH   Choice offered to / List presented to:  C-1 Patient        HH arranged  HH-1 RN  HH-2 PT      [facility] agency  [facility]   Status of service:  Completed, signed off Medicare Important Message given?  NO (If response is "NO", the following Medicare IM given date fields will be blank) Date Medicare IM given:   Date Additional Medicare IM given:    Discharge Disposition:  HOME W HOME HEALTH SERVICES  Per UR Regulation:  Reviewed for med. necessity/level of care/duration of stay  If discussed at Long Length of Stay Meetings, dates discussed:    Comments:  07/04/2012 1255  Contacted pt and made her aware info was resent to [place]. States Vinnie Langton just called to schedule services. Isidoro Donning RN CCM Case Mgmt phone [phone]  07/04/2012 1215 Received call from pt, stating Genevieve Norlander is saying they do not have any paperwork for her HH. Explained NCM will follow up with [place]. Contacted [facility] and transferred to [facility] office. Spoke to [place] and she requested NCM fax order. Faxed orders for [facility] to Vinnie Langton, Director at # [phone]. Isidoro Donning RN CCM Case Mgmt phone [phone]  07/02/2012  Konrad Felix RN, case manager   [phone] Contacted directly by patient (she received the number from [facility] CM) over concern that a nurse had not arrived to draw her blood for PT/INRs. After much investigation, it was determined that draws are not to start occurring until Monday. In  addition, Friday's CM probably called our local reporesentative for the referral and the office at her location did not have the order yet. In communitication with the [facility] office we were able to determine that orders were probably given to the [place] office. Patient has since been notified of the upcoming appointments.  07/01/12  10:32  Anette Guarneri RN/CM HHPT/RN ordered, patient choice is [employer], arranged for HHPT/RN to begin after discharge.

## 2012-07-01 NOTE — Discharge Summary (Signed)
Admitted for first stage two level anterior lumbar fusion as a prelude to posterior th-lumb-sac fusion.  No postop complications.  Much relief of pain.  Will return to office in one month.  Rx for Coumadin given.  She has hydrocodone at home. Condidtion on dischrge - ambulatory, improved pain  Discharge Diagnosis: lumbar spondylosis and scoliosis

## 2012-07-01 NOTE — Consult Note (Signed)
ANTICOAGULATION CONSULT - COUMADIN  Pharmacy Consult for Coumadin  HPI: 56 y.o.female admitted for lumbar spondylosis L4-5/L5-S1 s/p lumbar laminectomy on Coumadin post-op for VTE prophylaxis   Allergies: Allergies  Allergen Reactions  . Sulfa Antibiotics Nausea Only    Height/Weight: Height: 5\' 6"  (167.6 cm) (preadmit 06/17/12) Weight: 213 lb 10 oz (96.9 kg) (preadmit 06/17/12) IBW/kg (Calculated) : 59.3   Vitals: Blood pressure 120/71, pulse 83, temperature 98.4 F (36.9 C), temperature source Oral, resp. rate 18, height 5\' 6"  (1.676 m), weight 213 lb 10 oz (96.9 kg), SpO2 97.00%.  Current active problems: Active Problems:  * No active hospital problems. *    Medical / Surgical History: Past Medical History  Diagnosis Date  . Peripheral neuropathy   . Joint pain   . Joint swelling   . Scoliosis   . Psoriasis   . Spastic bladder     sporadic  . Menopause     takes Wellbutrin bid  . Arthritis     back   . Chronic lower back pain    Past Surgical History  Procedure Date  . Gum surgery 06/2012    "created flap for upper bridge, all across front"  . Laminectomy 11/2010  . Colonoscopy   . Esophagogastroduodenoscopy   . Anterior lumbar fusion 89/24/2013    "cages"  . Anterior fusion cervical spine 2000  . Back surgery   . Refractive surgery ~ 2010    bilaterally  . Spinal cord stimulator insertion 07/2011  . Cholecystectomy 1990's  . Abdominal hysterectomy 1990's  . Appendectomy 1990's    "think they did when they did hysterectomy"  . Anterior lumbar fusion 06/28/2012    Procedure: ANTERIOR LUMBAR FUSION 2 LEVELS;  Surgeon: Mat Carne, MD;  Location: [facility] OR;  Service: Orthopedics;  Laterality: Bilateral;  1st stage anterior interbody fusion L4-5/L5-S1    Current Labs:   Basename 07/01/12 0632 06/30/12 0525 06/29/12 0500  HGB -- -- 12.2  HCT -- -- 36.8  PLT -- -- 159  LABPROT 21.5* 17.1* 13.4  INR 1.95* 1.43 1.03  CREATININE -- -- 0.79   Lab  Results  Component Value Date   INR 1.95* 07/01/2012   INR 1.43 06/30/2012   INR 1.03 06/29/2012   Estimated Creatinine Clearance: 94.3 ml/min (by C-G formula based on Cr of 0.79).  Pertinent Medication History: Scheduled:    . buPROPion  150 mg Oral QHS  . estradiol  1 mg Oral Daily  . gabapentin  1,200 mg Oral QHS  . gabapentin  600 mg Oral Custom  . pantoprazole (PROTONIX) IV  40 mg Intravenous QHS  . sodium chloride  3 mL Intravenous Q12H  . vitamin C  1,000 mg Oral QHS  . warfarin  7.5 mg Oral ONCE-1800  . Warfarin - Pharmacist Dosing Inpatient   Does not apply q1800    Assessment:  Large jump overnight of INR to 1.95  No complications noted.  Goals:  Target INR of 2-3.  Plan:  Disccharge home on Coumadin  NO Coumadin today.   Begin Coumadin 5 mg daily Sat.  Next INR by Home Health Monday.   Further doses pending results.  Samy Ryner, Elisha Headland, Pharm. D. 07/01/2012, 9:34 AM

## 2012-08-17 ENCOUNTER — Encounter (HOSPITAL_COMMUNITY): Payer: Self-pay | Admitting: Pharmacy Technician

## 2012-08-26 ENCOUNTER — Other Ambulatory Visit: Payer: Self-pay | Admitting: Orthopedic Surgery

## 2012-08-26 ENCOUNTER — Encounter (HOSPITAL_COMMUNITY): Payer: Self-pay

## 2012-08-26 ENCOUNTER — Encounter (HOSPITAL_COMMUNITY)
Admission: RE | Admit: 2012-08-26 | Discharge: 2012-08-26 | Disposition: A | Discharge status: Home / Self Care | Payer: BC Managed Care – PPO | Source: Ambulatory Visit | Attending: Orthopedic Surgery | Admitting: Orthopedic Surgery

## 2012-08-26 LAB — CBC
HCT: 39 % (ref 36.0–46.0)
MCHC: 33.1 g/dL (ref 30.0–36.0)
MCV: 92.2 fL (ref 78.0–100.0)
Platelets: 180 10*3/uL (ref 150–400)
RDW: 13.7 % (ref 11.5–15.5)
WBC: 6.6 10*3/uL (ref 4.0–10.5)

## 2012-08-26 LAB — URINALYSIS, ROUTINE W REFLEX MICROSCOPIC
Bilirubin Urine: NEGATIVE
Nitrite: NEGATIVE
Specific Gravity, Urine: 1.02 (ref 1.005–1.030)
Urobilinogen, UA: 0.2 mg/dL (ref 0.0–1.0)

## 2012-08-26 LAB — COMPREHENSIVE METABOLIC PANEL
ALT: 18 U/L (ref 0–35)
AST: 19 U/L (ref 0–37)
CO2: 30 mEq/L (ref 19–32)
Chloride: 103 mEq/L (ref 96–112)
Creatinine, Ser: 0.79 mg/dL (ref 0.50–1.10)
GFR calc non Af Amer: >90 mL/min (ref >90)
Glucose, Bld: 92 mg/dL (ref 70–99)
Sodium: 141 mEq/L (ref 135–145)
Total Bilirubin: 0.4 mg/dL (ref 0.3–1.2)

## 2012-08-26 LAB — APTT: aPTT: 28 seconds (ref 24–37)

## 2012-08-26 NOTE — Pre-Procedure Instructions (Signed)
20 [name]  08/26/2012   Your procedure is scheduled on:  08/30/12  Report to [facility] at 1000 AM.  Call this number if you have problems the morning of surgery: [phone]   Remember:   Do not eat food:After Midnight.    Take these medicines the morning of surgery with A SIP OF WATER: neurontin,hydocodone,claritin   Do not wear jewelry, make-up or nail polish.  Do not wear lotions, powders, or perfumes. You may wear deodorant.  Do not shave 48 hours prior to surgery. Men may shave face and neck.  Do not bring valuables to the hospital.  Contacts, dentures or bridgework may not be worn into surgery.  Leave suitcase in the car. After surgery it may be brought to your room.  For patients admitted to the hospital, checkout time is 11:00 AM the day of discharge.   Patients discharged the day of surgery will not be allowed to drive home.  Name and phone number of your driver: family  Special Instructions: Shower using CHG 2 nights before surgery and the night before surgery.  If you shower the day of surgery use CHG.  Use special wash - you have one bottle of CHG for all showers.  You should use approximately 1/3 of the bottle for each shower.   Please read over the following fact sheets that you were given: Pain Booklet, Coughing and Deep Breathing, Blood Transfusion Information, MRSA Information and Surgical Site Infection Prevention

## 2012-09-01 MED ORDER — CHLORHEXIDINE GLUCONATE 4 % EX LIQD: 60.0000 mL | Freq: Once | CUTANEOUS | Status: DC

## 2012-09-01 MED ORDER — CEFAZOLIN SODIUM-DEXTROSE 2-3 GM-% IV SOLR
2.0000 g | INTRAVENOUS | Status: AC
  Administered 2012-09-02 (×2): 2 g via INTRAVENOUS
  Filled 2012-09-01: qty 50

## 2012-09-01 MED ORDER — SODIUM CHLORIDE 0.9 % IV SOLN: 75.0000 mL/h | INTRAVENOUS | Status: DC

## 2012-09-02 ENCOUNTER — Encounter (HOSPITAL_COMMUNITY)
Admission: RE | Disposition: A | Discharge status: Home / Self Care | Payer: Self-pay | Source: Ambulatory Visit | Attending: Orthopedic Surgery

## 2012-09-02 ENCOUNTER — Encounter (HOSPITAL_COMMUNITY): Payer: Self-pay | Admitting: Certified Registered Nurse Anesthetist

## 2012-09-02 ENCOUNTER — Inpatient Hospital Stay (HOSPITAL_COMMUNITY)
Admission: RE | Admit: 2012-09-02 | Discharge: 2012-09-10 | DRG: 884 | Disposition: A | Discharge status: Home / Self Care | Payer: BC Managed Care – PPO | Source: Ambulatory Visit | Attending: Orthopedic Surgery | Admitting: Orthopedic Surgery

## 2012-09-02 ENCOUNTER — Inpatient Hospital Stay (HOSPITAL_COMMUNITY): Payer: BC Managed Care – PPO

## 2012-09-02 ENCOUNTER — Inpatient Hospital Stay (HOSPITAL_COMMUNITY): Payer: BC Managed Care – PPO | Admitting: Certified Registered Nurse Anesthetist

## 2012-09-02 ENCOUNTER — Encounter (HOSPITAL_COMMUNITY): Payer: Self-pay | Admitting: *Deleted

## 2012-09-02 DIAGNOSIS — M47817 Spondylosis without myelopathy or radiculopathy, lumbosacral region: Secondary | ICD-10-CM | POA: Diagnosis present

## 2012-09-02 DIAGNOSIS — N951 Menopausal and female climacteric states: Secondary | ICD-10-CM

## 2012-09-02 DIAGNOSIS — Z01812 Encounter for preprocedural laboratory examination: Secondary | ICD-10-CM

## 2012-09-02 DIAGNOSIS — G8918 Other acute postprocedural pain: Secondary | ICD-10-CM

## 2012-09-02 DIAGNOSIS — Z87891 Personal history of nicotine dependence: Secondary | ICD-10-CM

## 2012-09-02 DIAGNOSIS — Z9289 Personal history of other medical treatment: Secondary | ICD-10-CM

## 2012-09-02 DIAGNOSIS — M539 Dorsopathy, unspecified: Secondary | ICD-10-CM

## 2012-09-02 DIAGNOSIS — M961 Postlaminectomy syndrome, not elsewhere classified: Secondary | ICD-10-CM | POA: Diagnosis present

## 2012-09-02 DIAGNOSIS — F329 Major depressive disorder, single episode, unspecified: Secondary | ICD-10-CM

## 2012-09-02 DIAGNOSIS — K5900 Constipation, unspecified: Secondary | ICD-10-CM

## 2012-09-02 DIAGNOSIS — G8929 Other chronic pain: Secondary | ICD-10-CM | POA: Diagnosis present

## 2012-09-02 DIAGNOSIS — M419 Scoliosis, unspecified: Secondary | ICD-10-CM

## 2012-09-02 DIAGNOSIS — M4120 Other idiopathic scoliosis, site unspecified: Principal | ICD-10-CM | POA: Diagnosis present

## 2012-09-02 DIAGNOSIS — M549 Dorsalgia, unspecified: Secondary | ICD-10-CM

## 2012-09-02 HISTORY — PX: SPINAL FUSION: SHX223

## 2012-09-02 HISTORY — DX: Personal history of other medical treatment: Z92.89

## 2012-09-02 LAB — BASIC METABOLIC PANEL
Calcium: 7.9 mg/dL — ABNORMAL LOW (ref 8.4–10.5)
GFR calc Af Amer: >90 mL/min (ref >90)
GFR calc non Af Amer: >90 mL/min (ref >90)
Potassium: 3.9 mEq/L (ref 3.5–5.1)
Sodium: 138 mEq/L (ref 135–145)

## 2012-09-02 LAB — CBC
Platelets: 93 10*3/uL — ABNORMAL LOW (ref 150–400)
RBC: 3.52 MIL/uL — ABNORMAL LOW (ref 3.87–5.11)
RDW: 14 % (ref 11.5–15.5)
WBC: 7.2 10*3/uL (ref 4.0–10.5)

## 2012-09-02 LAB — POCT I-STAT 4, (NA,K, GLUC, HGB,HCT)
Glucose, Bld: 106 mg/dL — ABNORMAL HIGH (ref 70–99)
Glucose, Bld: 97 mg/dL (ref 70–99)
HCT: 30 % — ABNORMAL LOW (ref 36.0–46.0)
HCT: 28 % — ABNORMAL LOW (ref 36.0–46.0)
Hemoglobin: 10.2 g/dL — ABNORMAL LOW (ref 12.0–15.0)
Hemoglobin: 9.5 g/dL — ABNORMAL LOW (ref 12.0–15.0)
Potassium: 4.3 meq/L (ref 3.5–5.1)
Potassium: 4.3 meq/L (ref 3.5–5.1)
Sodium: 139 meq/L (ref 135–145)
Sodium: 139 meq/L (ref 135–145)

## 2012-09-02 LAB — PROTIME-INR
INR: 1.16 (ref 0.00–1.49)
Prothrombin Time: 14.6 seconds (ref 11.6–15.2)

## 2012-09-02 SURGERY — FUSION, SPINE, 2 OR MORE LEVELS, POSTERIOR APPROACH
Anesthesia: General | Site: Spine Lumbar | Wound class: Clean

## 2012-09-02 MED ORDER — DOUBLE ANTIBIOTIC 500-10000 UNIT/GM EX OINT
TOPICAL_OINTMENT | CUTANEOUS | Status: AC
  Filled 2012-09-02: qty 1

## 2012-09-02 MED ORDER — ESTRADIOL 2 MG PO TABS
2.0000 mg | ORAL_TABLET | Freq: Every day | ORAL | Status: DC
  Administered 2012-09-03 – 2012-09-09 (×7): 2 mg via ORAL
  Filled 2012-09-02 (×8): qty 1

## 2012-09-02 MED ORDER — HYDROMORPHONE HCL PF 1 MG/ML IJ SOLN
INTRAMUSCULAR | Status: AC
  Administered 2012-09-02: 1 mg via INTRAVENOUS
  Filled 2012-09-02: qty 1

## 2012-09-02 MED ORDER — VANCOMYCIN HCL 1000 MG IV SOLR
INTRAVENOUS | Status: AC
  Filled 2012-09-02: qty 1000

## 2012-09-02 MED ORDER — BUPIVACAINE LIPOSOME 1.3 % IJ SUSP
20.0000 mL | Freq: Once | INTRAMUSCULAR | Status: DC
  Filled 2012-09-02: qty 20

## 2012-09-02 MED ORDER — SODIUM CHLORIDE 0.9 % IJ SOLN: 3.0000 mL | INTRAMUSCULAR | Status: DC | PRN

## 2012-09-02 MED ORDER — ROCURONIUM BROMIDE 100 MG/10ML IV SOLN
INTRAVENOUS | Status: DC | PRN
  Administered 2012-09-02: 15 mg via INTRAVENOUS

## 2012-09-02 MED ORDER — THROMBIN 20000 UNITS EX SOLR
OROMUCOSAL | Status: DC | PRN
  Administered 2012-09-02: 09:00:00 via TOPICAL

## 2012-09-02 MED ORDER — SODIUM CHLORIDE 0.9 % IV SOLN: 250.0000 mL | INTRAVENOUS | Status: DC

## 2012-09-02 MED ORDER — SODIUM CHLORIDE 0.9 % IJ SOLN
3.0000 mL | Freq: Two times a day (BID) | INTRAMUSCULAR | Status: DC
  Administered 2012-09-02 – 2012-09-06 (×7): 3 mL via INTRAVENOUS

## 2012-09-02 MED ORDER — GABAPENTIN 600 MG PO TABS
600.0000 mg | ORAL_TABLET | Freq: Three times a day (TID) | ORAL | Status: DC
  Administered 2012-09-02 – 2012-09-07 (×16): 600 mg via ORAL
  Filled 2012-09-02 (×19): qty 1

## 2012-09-02 MED ORDER — DIPHENHYDRAMINE HCL 50 MG/ML IJ SOLN
12.5000 mg | Freq: Four times a day (QID) | INTRAMUSCULAR | Status: DC | PRN
  Administered 2012-09-02: 12.5 mg via INTRAVENOUS
  Filled 2012-09-02 (×2): qty 1

## 2012-09-02 MED ORDER — SODIUM CHLORIDE 0.9 % IR SOLN
Status: DC | PRN
  Administered 2012-09-02 (×2)

## 2012-09-02 MED ORDER — HYDROCODONE-ACETAMINOPHEN 10-325 MG PO TABS
1.0000 | ORAL_TABLET | ORAL | Status: DC | PRN
  Administered 2012-09-03 (×3): 1 via ORAL
  Filled 2012-09-02 (×3): qty 1

## 2012-09-02 MED ORDER — LIDOCAINE-EPINEPHRINE (PF) 1 %-1:200000 IJ SOLN
INTRAMUSCULAR | Status: DC | PRN
  Administered 2012-09-02: 36 mL

## 2012-09-02 MED ORDER — HYDROMORPHONE HCL PF 1 MG/ML IJ SOLN
0.5000 mg | INTRAMUSCULAR | Status: DC | PRN
  Administered 2012-09-02 (×2): 1 mg via INTRAVENOUS
  Filled 2012-09-02: qty 1

## 2012-09-02 MED ORDER — HYDROMORPHONE HCL PF 1 MG/ML IJ SOLN
INTRAMUSCULAR | Status: AC
  Administered 2012-09-02: 0.5 mg via INTRAVENOUS
  Filled 2012-09-02: qty 2

## 2012-09-02 MED ORDER — HYDROMORPHONE HCL PF 1 MG/ML IJ SOLN
0.5000 mg | INTRAMUSCULAR | Status: AC | PRN
  Administered 2012-09-02 (×4): 0.5 mg via INTRAVENOUS

## 2012-09-02 MED ORDER — GENTAMICIN SULFATE 40 MG/ML IJ SOLN
INTRAMUSCULAR | Status: AC
  Filled 2012-09-02: qty 4

## 2012-09-02 MED ORDER — PANTOPRAZOLE SODIUM 40 MG IV SOLR
40.0000 mg | Freq: Every day | INTRAVENOUS | Status: DC
  Administered 2012-09-02 – 2012-09-04 (×3): 40 mg via INTRAVENOUS
  Filled 2012-09-02 (×4): qty 40

## 2012-09-02 MED ORDER — VANCOMYCIN HCL 500 MG IV SOLR
INTRAVENOUS | Status: DC | PRN
  Administered 2012-09-02: 500 mg

## 2012-09-02 MED ORDER — MIDAZOLAM HCL 5 MG/5ML IJ SOLN
INTRAMUSCULAR | Status: DC | PRN
  Administered 2012-09-02: 2 mg via INTRAVENOUS

## 2012-09-02 MED ORDER — DOCUSATE SODIUM 100 MG PO CAPS
100.0000 mg | ORAL_CAPSULE | Freq: Two times a day (BID) | ORAL | Status: DC
  Administered 2012-09-02 – 2012-09-09 (×14): 100 mg via ORAL
  Filled 2012-09-02 (×17): qty 1

## 2012-09-02 MED ORDER — SUCCINYLCHOLINE CHLORIDE 20 MG/ML IJ SOLN
INTRAMUSCULAR | Status: DC | PRN
  Administered 2012-09-02: 100 mg via INTRAVENOUS

## 2012-09-02 MED ORDER — LACTATED RINGERS IV SOLN
INTRAVENOUS | Status: DC | PRN
  Administered 2012-09-02 (×3): via INTRAVENOUS

## 2012-09-02 MED ORDER — THROMBIN 20000 UNITS EX KIT
PACK | CUTANEOUS | Status: AC
  Filled 2012-09-02: qty 1

## 2012-09-02 MED ORDER — PROPOFOL INFUSION 10 MG/ML OPTIME
INTRAVENOUS | Status: DC | PRN
  Administered 2012-09-02: 50 ug/kg/min via INTRAVENOUS

## 2012-09-02 MED ORDER — ARTIFICIAL TEARS OP OINT
TOPICAL_OINTMENT | OPHTHALMIC | Status: DC | PRN
  Administered 2012-09-02: 1 via OPHTHALMIC

## 2012-09-02 MED ORDER — CEFAZOLIN SODIUM 1-5 GM-% IV SOLN
INTRAVENOUS | Status: AC
  Filled 2012-09-02: qty 100

## 2012-09-02 MED ORDER — VITAMIN C 500 MG PO TABS
500.0000 mg | ORAL_TABLET | Freq: Two times a day (BID) | ORAL | Status: DC
  Administered 2012-09-02 – 2012-09-09 (×15): 500 mg via ORAL
  Filled 2012-09-02 (×17): qty 1

## 2012-09-02 MED ORDER — ONDANSETRON HCL 4 MG/2ML IJ SOLN: 4.0000 mg | Freq: Once | INTRAMUSCULAR | Status: DC | PRN

## 2012-09-02 MED ORDER — BUPIVACAINE HCL (PF) 0.25 % IJ SOLN
INTRAMUSCULAR | Status: AC
  Filled 2012-09-02: qty 30

## 2012-09-02 MED ORDER — CEFAZOLIN SODIUM 1-5 GM-% IV SOLN
1.0000 g | Freq: Three times a day (TID) | INTRAVENOUS | Status: AC
  Administered 2012-09-02 – 2012-09-03 (×2): 1 g via INTRAVENOUS
  Filled 2012-09-02 (×2): qty 50

## 2012-09-02 MED ORDER — ALBUMIN HUMAN 5 % IV SOLN
INTRAVENOUS | Status: DC | PRN
  Administered 2012-09-02 (×2): via INTRAVENOUS

## 2012-09-02 MED ORDER — FENTANYL CITRATE 0.05 MG/ML IJ SOLN
INTRAMUSCULAR | Status: DC | PRN
  Administered 2012-09-02: 100 ug via INTRAVENOUS
  Administered 2012-09-02 (×4): 50 ug via INTRAVENOUS
  Administered 2012-09-02: 100 ug via INTRAVENOUS
  Administered 2012-09-02: 150 ug via INTRAVENOUS
  Administered 2012-09-02: 50 ug via INTRAVENOUS
  Administered 2012-09-02: 100 ug via INTRAVENOUS
  Administered 2012-09-02 (×2): 50 ug via INTRAVENOUS
  Administered 2012-09-02: 25 ug via INTRAVENOUS
  Administered 2012-09-02: 50 ug via INTRAVENOUS
  Administered 2012-09-02: 25 ug via INTRAVENOUS
  Administered 2012-09-02 (×3): 50 ug via INTRAVENOUS
  Administered 2012-09-02: 25 ug via INTRAVENOUS
  Administered 2012-09-02: 50 ug via INTRAVENOUS
  Administered 2012-09-02: 25 ug via INTRAVENOUS

## 2012-09-02 MED ORDER — SENNA 8.6 MG PO TABS
1.0000 | ORAL_TABLET | Freq: Two times a day (BID) | ORAL | Status: DC
  Administered 2012-09-02 – 2012-09-07 (×10): 8.6 mg via ORAL
  Filled 2012-09-02 (×11): qty 1

## 2012-09-02 MED ORDER — PHENOL 1.4 % MT LIQD: 1.0000 | OROMUCOSAL | Status: DC | PRN

## 2012-09-02 MED ORDER — BISACODYL 10 MG RE SUPP
10.0000 mg | Freq: Every day | RECTAL | Status: DC | PRN
  Administered 2012-09-09: 10 mg via RECTAL
  Filled 2012-09-02: qty 1

## 2012-09-02 MED ORDER — KCL IN DEXTROSE-NACL 20-5-0.45 MEQ/L-%-% IV SOLN
INTRAVENOUS | Status: DC
  Administered 2012-09-02 – 2012-09-03 (×3): via INTRAVENOUS
  Administered 2012-09-04: 1000 mL via INTRAVENOUS
  Administered 2012-09-04: 23:00:00 via INTRAVENOUS
  Filled 2012-09-02 (×10): qty 1000

## 2012-09-02 MED ORDER — HYDROMORPHONE HCL PF 1 MG/ML IJ SOLN
INTRAMUSCULAR | Status: DC | PRN
  Administered 2012-09-02: .4 mg via INTRAVENOUS
  Administered 2012-09-02 (×2): .3 mg via INTRAVENOUS

## 2012-09-02 MED ORDER — LACTATED RINGERS IV SOLN
INTRAVENOUS | Status: DC | PRN
  Administered 2012-09-02 (×4): via INTRAVENOUS

## 2012-09-02 MED ORDER — MENTHOL 3 MG MT LOZG
1.0000 | LOZENGE | OROMUCOSAL | Status: DC | PRN
  Filled 2012-09-02: qty 9

## 2012-09-02 MED ORDER — THROMBIN 20000 UNITS EX SOLR
CUTANEOUS | Status: AC
  Filled 2012-09-02: qty 20000

## 2012-09-02 MED ORDER — LIDOCAINE HCL (CARDIAC) 20 MG/ML IV SOLN
INTRAVENOUS | Status: DC | PRN
  Administered 2012-09-02: 65 mg via INTRAVENOUS

## 2012-09-02 MED ORDER — PHENYLEPHRINE HCL 10 MG/ML IJ SOLN
INTRAMUSCULAR | Status: DC | PRN
  Administered 2012-09-02 (×2): 80 ug via INTRAVENOUS
  Administered 2012-09-02: 40 ug via INTRAVENOUS

## 2012-09-02 MED ORDER — LORATADINE 10 MG PO TABS
10.0000 mg | ORAL_TABLET | Freq: Every day | ORAL | Status: DC | PRN
  Filled 2012-09-02: qty 1

## 2012-09-02 MED ORDER — HEMOSTATIC AGENTS (NO CHARGE) OPTIME
TOPICAL | Status: DC | PRN
  Administered 2012-09-02: 1 via TOPICAL

## 2012-09-02 MED ORDER — FLEET ENEMA 7-19 GM/118ML RE ENEM
1.0000 | ENEMA | Freq: Once | RECTAL | Status: AC | PRN
  Filled 2012-09-02: qty 1

## 2012-09-02 MED ORDER — SODIUM CHLORIDE 0.9 % IV SOLN
10.0000 mg | INTRAVENOUS | Status: DC | PRN
  Administered 2012-09-02: 25 ug/min via INTRAVENOUS

## 2012-09-02 MED ORDER — LIDOCAINE-EPINEPHRINE (PF) 1 %-1:200000 IJ SOLN
INTRAMUSCULAR | Status: AC
  Filled 2012-09-02: qty 10

## 2012-09-02 MED ORDER — BUPROPION HCL ER (SR) 150 MG PO TB12
150.0000 mg | ORAL_TABLET | Freq: Two times a day (BID) | ORAL | Status: DC
  Administered 2012-09-02 – 2012-09-09 (×13): 150 mg via ORAL
  Filled 2012-09-02 (×17): qty 1

## 2012-09-02 MED ORDER — ONDANSETRON HCL 4 MG/2ML IJ SOLN
4.0000 mg | INTRAMUSCULAR | Status: DC | PRN
  Administered 2012-09-02: 4 mg via INTRAVENOUS
  Filled 2012-09-02: qty 2

## 2012-09-02 MED ORDER — LIDOCAINE HCL 4 % MT SOLN
OROMUCOSAL | Status: DC | PRN
  Administered 2012-09-02: 4 mL via TOPICAL

## 2012-09-02 MED ORDER — ACETAMINOPHEN 325 MG PO TABS: 650.0000 mg | ORAL_TABLET | ORAL | Status: DC | PRN

## 2012-09-02 MED ORDER — PROPOFOL 10 MG/ML IV BOLUS
INTRAVENOUS | Status: DC | PRN
  Administered 2012-09-02: 200 mg via INTRAVENOUS

## 2012-09-02 MED ORDER — ACETAMINOPHEN 650 MG RE SUPP: 650.0000 mg | RECTAL | Status: DC | PRN

## 2012-09-02 MED ORDER — GENTAMICIN SULFATE 40 MG/ML IJ SOLN
INTRAMUSCULAR | Status: AC
  Filled 2012-09-02: qty 2

## 2012-09-02 MED ORDER — HYDROMORPHONE HCL PF 1 MG/ML IJ SOLN
0.2500 mg | INTRAMUSCULAR | Status: DC | PRN
  Administered 2012-09-02: 0.5 mg via INTRAVENOUS

## 2012-09-02 MED ORDER — HYDROMORPHONE HCL PF 1 MG/ML IJ SOLN
INTRAMUSCULAR | Status: AC
  Administered 2012-09-02: 0.5 mg via INTRAVENOUS
  Filled 2012-09-02: qty 1

## 2012-09-02 MED ORDER — BUPIVACAINE LIPOSOME 1.3 % IJ SUSP
INTRAMUSCULAR | Status: DC | PRN
  Administered 2012-09-02: 20 mL

## 2012-09-02 MED ORDER — ONDANSETRON HCL 4 MG/2ML IJ SOLN
INTRAMUSCULAR | Status: DC | PRN
  Administered 2012-09-02: 4 mg via INTRAVENOUS

## 2012-09-02 MED ORDER — HEMOSTATIC AGENTS (NO CHARGE) OPTIME
TOPICAL | Status: DC | PRN
  Administered 2012-09-02 (×2): 1 via TOPICAL

## 2012-09-02 MED ORDER — GENTAMICIN SULFATE 40 MG/ML IJ SOLN
INTRAMUSCULAR | Status: DC | PRN
  Administered 2012-09-02: 240 mg

## 2012-09-02 MED ORDER — VANCOMYCIN HCL 500 MG IV SOLR
INTRAVENOUS | Status: AC
  Filled 2012-09-02: qty 500

## 2012-09-02 MED ORDER — HYDROMORPHONE HCL PF 1 MG/ML IJ SOLN
INTRAMUSCULAR | Status: AC
  Filled 2012-09-02: qty 1

## 2012-09-02 MED ORDER — BUPIVACAINE HCL (PF) 0.25 % IJ SOLN
INTRAMUSCULAR | Status: DC | PRN
  Administered 2012-09-02: 36 mL

## 2012-09-02 SURGICAL SUPPLY — 112 items
BLADE SURG ROTATE 9660 (MISCELLANEOUS) IMPLANT
BUR MATCHSTICK NEURO 3.0 LAGG (BURR) IMPLANT
CANISTER SUCTION 2500CC (MISCELLANEOUS) ×2 IMPLANT
CARTRIDGE OIL MAESTRO DRILL (MISCELLANEOUS) ×1 IMPLANT
CLOTH BEACON ORANGE TIMEOUT ST (SAFETY) ×2 IMPLANT
CONNECTOR OFFSET LATERAL 50MM (Connector) ×2 IMPLANT
CONNECTOR ROD 5.5 OPEN/CLOSED (Connector) ×8 IMPLANT
CONT SPECI 4OZ STER CLIK (MISCELLANEOUS) ×2 IMPLANT
CORDS BIPOLAR (ELECTRODE) ×2 IMPLANT
COVER SURGICAL LIGHT HANDLE (MISCELLANEOUS) ×2 IMPLANT
DECANTER SPIKE VIAL GLASS SM (MISCELLANEOUS) ×4 IMPLANT
DERMABOND ADVANCED (GAUZE/BANDAGES/DRESSINGS) ×1
DERMABOND ADVANCED .7 DNX12 (GAUZE/BANDAGES/DRESSINGS) ×1 IMPLANT
DIFFUSER DRILL AIR PNEUMATIC (MISCELLANEOUS) ×2 IMPLANT
DRAIN CHANNEL 15F RND FF W/TCR (WOUND CARE) ×2 IMPLANT
DRAIN TLS ROUND 10FR (DRAIN) IMPLANT
DRAPE PROXIMA HALF (DRAPES) ×10 IMPLANT
DRAPE SURG 17X23 STRL (DRAPES) ×8 IMPLANT
DRAPE TABLE COVER HEAVY DUTY (DRAPES) IMPLANT
DRSG MEPILEX BORDER 4X12 (GAUZE/BANDAGES/DRESSINGS) ×2 IMPLANT
DRSG MEPILEX BORDER 4X4 (GAUZE/BANDAGES/DRESSINGS) ×2 IMPLANT
DRSG MEPILEX BORDER 4X8 (GAUZE/BANDAGES/DRESSINGS) ×2 IMPLANT
DRSG OPSITE 11X17.75 LRG (GAUZE/BANDAGES/DRESSINGS) IMPLANT
DURAPREP 26ML APPLICATOR (WOUND CARE) ×2 IMPLANT
ELECT BLADE 4.0 EZ CLEAN MEGAD (MISCELLANEOUS) ×4
ELECT CAUTERY BLADE 6.4 (BLADE) ×2 IMPLANT
ELECT REM PT RETURN 9FT ADLT (ELECTROSURGICAL) ×2
ELECTRODE BLDE 4.0 EZ CLN MEGD (MISCELLANEOUS) ×2 IMPLANT
ELECTRODE REM PT RTRN 9FT ADLT (ELECTROSURGICAL) ×1 IMPLANT
EVACUATOR 1/8 PVC DRAIN (DRAIN) ×2 IMPLANT
EVACUATOR SILICONE 100CC (DRAIN) ×2 IMPLANT
GAUZE SPONGE 4X4 16PLY XRAY LF (GAUZE/BANDAGES/DRESSINGS) ×4 IMPLANT
GLOVE BIOGEL PI IND STRL 7.0 (GLOVE) ×2 IMPLANT
GLOVE BIOGEL PI IND STRL 7.5 (GLOVE) IMPLANT
GLOVE BIOGEL PI IND STRL 8 (GLOVE) ×1 IMPLANT
GLOVE BIOGEL PI IND STRL 9 (GLOVE) ×1 IMPLANT
GLOVE BIOGEL PI INDICATOR 7.0 (GLOVE) ×2
GLOVE BIOGEL PI INDICATOR 7.5 (GLOVE)
GLOVE BIOGEL PI INDICATOR 8 (GLOVE) ×1
GLOVE BIOGEL PI INDICATOR 9 (GLOVE) ×1
GLOVE BIOGEL PI ORTHO PRO 7.5 (GLOVE) ×1
GLOVE PI ORTHO PRO STRL 7.5 (GLOVE) ×1 IMPLANT
GLOVE SS BIOGEL STRL SZ 7 (GLOVE) IMPLANT
GLOVE SS BIOGEL STRL SZ 8.5 (GLOVE) ×3 IMPLANT
GLOVE SUPERSENSE BIOGEL SZ 7 (GLOVE)
GLOVE SUPERSENSE BIOGEL SZ 8.5 (GLOVE) ×3
GLOVE SURG SS PI 7.0 STRL IVOR (GLOVE) ×6 IMPLANT
GLOVE SURG SS PI 7.5 STRL IVOR (GLOVE) ×8 IMPLANT
GLOVE SURG SS PI 8.0 STRL IVOR (GLOVE) ×4 IMPLANT
GOWN PREVENTION PLUS XLARGE (GOWN DISPOSABLE) ×2 IMPLANT
GOWN SRG XL XLNG 56XLVL 4 (GOWN DISPOSABLE) ×6 IMPLANT
GOWN STRL NON-REIN LRG LVL3 (GOWN DISPOSABLE) IMPLANT
GOWN STRL NON-REIN XL XLG LVL4 (GOWN DISPOSABLE) ×6
HOOK LUMBAR SMALL (Hook) ×4 IMPLANT
HOOK OFFSET THORACIC RIGHT (Hook) ×2 IMPLANT
HOOK PEDICLE SMALL (Hook) ×10 IMPLANT
HOOK THORACIC NARROW (Orthopedic Implant) ×2 IMPLANT
K2M Polyaxial screw 7.5 X 55 (Screw) ×2 IMPLANT
KIT BASIN OR (CUSTOM PROCEDURE TRAY) ×2 IMPLANT
KIT POSITION SURG JACKSON T1 (MISCELLANEOUS) ×2 IMPLANT
KIT ROOM TURNOVER OR (KITS) ×2 IMPLANT
KIT STIMULAN RAPID CURE  10CC (Orthopedic Implant) ×1 IMPLANT
KIT STIMULAN RAPID CURE 10CC (Orthopedic Implant) ×1 IMPLANT
LONG ROD TEMPLANT ×2 IMPLANT
MANIFOLD NEPTUNE II (INSTRUMENTS) ×2 IMPLANT
NEEDLE 18GX1X1/2 (RX/OR ONLY) (NEEDLE) ×2 IMPLANT
NEEDLE 22X1 1/2 (OR ONLY) (NEEDLE) ×2 IMPLANT
NEEDLE SPNL 22GX3.5 QUINCKE BK (NEEDLE) ×2 IMPLANT
NS IRRIG 1000ML POUR BTL (IV SOLUTION) ×2 IMPLANT
OIL CARTRIDGE MAESTRO DRILL (MISCELLANEOUS) ×2
PACK LAMINECTOMY ORTHO (CUSTOM PROCEDURE TRAY) ×2 IMPLANT
PACK UNIVERSAL I (CUSTOM PROCEDURE TRAY) ×2 IMPLANT
PAD ARMBOARD 7.5X6 YLW CONV (MISCELLANEOUS) ×4 IMPLANT
PATTIES SURGICAL .5 X1 (DISPOSABLE) ×2 IMPLANT
PATTIES SURGICAL .75X.75 (GAUZE/BANDAGES/DRESSINGS) IMPLANT
PATTIES SURGICAL 1X1 (DISPOSABLE) ×2 IMPLANT
PENCIL BUTTON HOLSTER BLD 10FT (ELECTRODE) ×2 IMPLANT
PUTTY OP1 (Orthopedic Implant) ×4 IMPLANT
ROD STRAIGHT 200MM (Rod) ×4 IMPLANT
ROD STRAIGHT 500MM (Rod) ×4 IMPLANT
SCREW POLYAXIAL 6.5X45MM (Screw) ×16 IMPLANT
SCREW POLYAXIAL 6.5X50MM (Screw) ×4 IMPLANT
SCREW POLYAXIAL 7.5X45 (Screw) ×4 IMPLANT
SCREW POLYAXIAL 7.5X70 (Screw) ×2 IMPLANT
SCREW SET ATR (Screw) ×24 IMPLANT
SET SCREW (Screw) ×16 IMPLANT
SET SCREW VRST (Screw) ×16 IMPLANT
SPONGE NEURO XRAY DETECT 1X3 (DISPOSABLE) ×2 IMPLANT
SPONGE SURGIFOAM ABS GEL 100 (HEMOSTASIS) ×2 IMPLANT
STAPLER VISISTAT (STAPLE) ×4 IMPLANT
SURGIFLO TRUKIT (HEMOSTASIS) ×4 IMPLANT
SUT ETHILON 2 0 FS 18 (SUTURE) ×4 IMPLANT
SUT VIC AB 0 CTX 18 (SUTURE) ×8 IMPLANT
SUT VIC AB 1 CT1 18XCR BRD 8 (SUTURE) ×1 IMPLANT
SUT VIC AB 1 CT1 8-18 (SUTURE) ×1
SUT VIC AB 1 CTX 18 (SUTURE) ×4 IMPLANT
SUT VIC AB 1 CTX 36 (SUTURE)
SUT VIC AB 1 CTX36XBRD ANBCTR (SUTURE) IMPLANT
SUT VIC AB 2-0 CT1 18 (SUTURE) ×4 IMPLANT
SUT VIC AB 2-0 CT1 27 (SUTURE)
SUT VIC AB 2-0 CT1 TAPERPNT 27 (SUTURE) IMPLANT
SUT VIC AB 3-0 X1 27 (SUTURE) IMPLANT
SYR 30ML SLIP (SYRINGE) ×2 IMPLANT
SYR BULB IRRIGATION 50ML (SYRINGE) ×2 IMPLANT
SYR CONTROL 10ML LL (SYRINGE) ×2 IMPLANT
SYSTEM CHEST DRAIN TLS 7FR (DRAIN) IMPLANT
TOWEL OR 17X24 6PK STRL BLUE (TOWEL DISPOSABLE) ×2 IMPLANT
TOWEL OR 17X26 10 PK STRL BLUE (TOWEL DISPOSABLE) ×2 IMPLANT
TRAY FOLEY CATH 14FR (SET/KITS/TRAYS/PACK) ×2 IMPLANT
TUBE SUCT ARGYLE STRL (TUBING) ×4 IMPLANT
WATER STERILE IRR 1000ML POUR (IV SOLUTION) ×2 IMPLANT
lateral offset connector, closed, 50mm (Connector) ×2 IMPLANT

## 2012-09-02 NOTE — Consult Note (Signed)
Name: [name] MRN: [id number] DOB: 1956-06-21    LOS: 0  PULMONARY / CRITICAL CARE MEDICINE  HPI:   56 years old female with no significant PMH except for chronic back pain of about two years secondary to lumbar scoliosis and spondylosis. She has history of a previous back surgery for an L5-S1 disc herniation with temporary resolution of her pain. Now she has recurrent back pain that is disabling. Today was admitted electively for a T8-S1 fusion + bone graft harvest and removal of spinal cord stim electrode. She underwent her surgery without apparent immediate complications. She is extubated, awake and alert. EBL per note was >2500cc with cell saver and received 4 units of FFP. Critical care consult called by Dr. Alveda Reasons for medical management.    Past Medical History  Diagnosis Date  . Peripheral neuropathy   . Joint pain   . Joint swelling   . Scoliosis   . Psoriasis   . Spastic bladder     sporadic  . Menopause     takes Wellbutrin bid  . Arthritis     back   . Chronic lower back pain    Past Surgical History  Procedure Date  . Gum surgery 06/2012    "created flap for upper bridge, all across front"  . Laminectomy 11/2010  . Colonoscopy   . Esophagogastroduodenoscopy   . Anterior lumbar fusion 89/24/2013    "cages"  . Anterior fusion cervical spine 2000  . Back surgery   . Refractive surgery ~ 2010    bilaterally  . Spinal cord stimulator insertion 07/2011  . Cholecystectomy 1990's  . Abdominal hysterectomy 1990's  . Appendectomy 1990's    "think they did when they did hysterectomy"  . Anterior lumbar fusion 06/28/2012    Procedure: ANTERIOR LUMBAR FUSION 2 LEVELS;  Surgeon: Mat Carne, MD;  Location: [facility] OR;  Service: Orthopedics;  Laterality: Bilateral;  1st stage anterior interbody fusion L4-5/L5-S1   Prior to Admission medications   Medication Sig Start Date End Date Taking? Authorizing Provider  buPROPion (WELLBUTRIN SR) 150 MG 12 hr tablet Take 150 mg  by mouth 2 (two) times daily.    Yes Historical Provider, MD  estradiol (ESTRACE) 2 MG tablet Take 2 mg by mouth daily.   Yes Historical Provider, MD  gabapentin (NEURONTIN) 600 MG tablet Take 600 mg by mouth daily as needed. For pain   Yes Historical Provider, MD  HYDROcodone-acetaminophen (NORCO) 10-325 MG per tablet Take 1 tablet by mouth every 4 (four) hours as needed. Pain   Yes Historical Provider, MD  loratadine (CLARITIN) 10 MG tablet Take 10 mg by mouth daily as needed. For allergies   Yes Historical Provider, MD  methocarbamol (ROBAXIN) 750 MG tablet Take 750 mg by mouth every 6 (six) hours.   Yes Historical Provider, MD  vitamin C (ASCORBIC ACID) 500 MG tablet Take 500 mg by mouth 2 (two) times daily.    Yes Historical Provider, MD   Allergies Allergies  Allergen Reactions  . Sulfa Antibiotics Nausea Only    Family History History reviewed. No pertinent family history. Social History  reports that she quit smoking about 2 years ago. Her smoking use included Cigarettes. She has a 18.75 pack-year smoking history. She has never used smokeless tobacco. She reports that she drinks alcohol. She reports that she does not use illicit drugs.  Review Of Systems:  All systems reviewed and found negative except for what I mentioned in the HPI.   Current  Status:  Vital Signs: Temp:  [97.9 F (36.6 C)-98.4 F (36.9 C)] 98.4 F (36.9 C) (11/29 1745) Pulse Rate:  [71-99] 94  (11/29 1915) Resp:  [14-18] 15  (11/29 1915) BP: (109-150)/(58-85) 126/65 mmHg (11/29 1915) SpO2:  [96 %-100 %] 100 % (11/29 1915)  Physical Examination: General:  Pleasant female patient in no acute distress Neuro:  Awake, alert, nonfocal HEENT:  PERRL, pink conjunctivae, moist membranes Neck:  Supple, no JVD   Cardiovascular:  RRR, no M/R/G Lungs:  CTA Abdomen:  Soft, nontender, nondistended, bowel sounds present Musculoskeletal:  Moves all extremities, no pedal edema. Surgical wound dressed. Small hematoma  on the upper part of the wound was reported. On exam looks small in size. JP and hemovac drainage in place.  Skin:  No rash  Laboratory data:  Lab 09/02/12 1540 09/02/12 1312  NA 139 139  K 4.3 4.3  CL -- --  CO2 -- --  BUN -- --  CREATININE -- --  CALCIUM -- --  MG -- --  PHOS -- --    Lab 09/02/12 1540 09/02/12 1312  HGB 9.5* 10.2*  HCT 28.0* 30.0*  PLT -- --  INR -- --  APTT -- --    ASSESSMENT: 56 years old female with no significant PMH except for chronic back pain of about two years secondary to lumbar scoliosis and spondylosis. Presents electively for  T8-S1 fusion + bone graft harvest and removal of spinal cord stim electrode. Now post surgery, extubated, awake, alert, oriented x 3, hemodynamically stable with no focal neurological signs. EBL of >2500, cell saver was used. Received 4 units of FFP.  RECOMMENDATIONS: - Will admit to 2300 - Cardiac monitoring per ICU protocol - Supplemental oxygen via [place] to keep O2 sat >92% - Will continue IVF's at 100cc/hr for today and d/c in the morning. - Incentive spirometry - DVT prophylaxis with SCD's and early ambulation. - Pain management as prescribed by surgical team. - Will follow CBC and chemistry in am - Wound care per surgical team. - Ancef per surgical team. - Will advance diet as tolerated. - Continue home medications once tolerating PO. - GI prophylaxis with IV protonix.  Critical Care Time devoted to patient care services described in this note is: 82 Minutes  Overton Mam, M.D. Pulmonary and Critical Care Medicine [facility] Pager: [phone]  09/02/2012, 7:35 PM

## 2012-09-02 NOTE — H&P (Signed)
[name] is here for a history and physical as a prelude to her having her first stage anterior lumbar fusion performed on the 24th of September 2013. This is planned as a first stage, prelude to a posterior thoracolumbar fusion.  I had asked that Dr. Woodfin Ganja do the exposure, but apparently he is not available that day, so we will be trying to get Dr. Jose Persia which is fine.   The following will constitute her history and physical:  CHIEF COMPLAINT:   Severe low back pain status post prior laminectomy.  HISTORY OF PRESENT ILLNESS: This woman has a fairly longstanding history of back pain and I performed a laminectomy for severe left sciatica well over a year ago, possibly 2 years. This helped her left leg pain. At that time there was great discussion about whether she should have a much larger operation to address her multilevel lumbar spondylosis and lumbar scoliosis, and I felt that addressing the disc herniation might help her enough that she would be able to continue on and be able to work, certainly not a likely prospect after the multilevel fusion. Unfortunately this didn't work out. Her back pain continued on as severe as ever. She now actually has some right leg pain.  She has had a spinal cord stimulator placed which helped her leg pain which at one point became very severe on the right side, but has not helped her back pain much. She is now severely limited. 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 Therefore after much discussion we are proceeding with a thoracolumbar sacral fusion, but there will be a first stage anterior interbody fusion at L4-5 and L5-S1.   PAST MEDICAL HISTORY:    She is in good health. She has had a cholecystectomy and a hysterectomy. She had a cervical fusion in the year 2000.  CURRENT MEDICATION:   She takes Norco 10 and gabapentin 600 for her back symptoms. The only other medicine she is on is  Esterase and Wellbutrin.  REVIEW OF SYSTEMS:    Essentially noncontributory.   EXAMINATION: Head, ears, eyes, nose and throat - pharynx clear, significantly limited range of cervical motion. No lymphadenopathy.  Cardiovascular - heart sounds normal, regular rhythm, no carotid bruits, no peripheral edema.  Respiratory - no adventitious breath sounds.  Abdomen - obese, nontender.  Musculoskeletal/Neurological - there is full straight leg raising bilaterally. There is no weakness noted on manual resistance testing in either lower extremity. Knee jerks and ankle jerks are trace bilaterally. There are no vascular changes in either lower extremity.   X-RAYS: Radiographs show a degenerative change superimposed on a lumbar scoliosis.   DIAGNOSIS: Lumbar scoliosis, lumbar spondylosis, post laminectomy lumbar spine.   DISCUSSION: We have had numerous discussions about the magnitude of the surgery which she is going to undertake. We have put this off as long as we can, I would like 1-1/2 to 2 years at least. She has realistic expectations and realistic goals.   The general and specific complications of the surgery include the anterior surgery have been discussed. We will proceed with this on June 28, 2012. I have told her that she can get in a pool and go swimming provided her wound is okay after about 2 weeks. If by chance this relieves her pain significantly, she can defer proceeding with the definitive thoracolumbar sacral fusion as long as she isn't too symptomatic.   Electronically verified by  Charlsie Quest, M.D. SMT:tal  D 06-17-12 T 06-20-12

## 2012-09-02 NOTE — H&P (Signed)
Examined this morning in Preop Holding  Chief complaint: Chronic disabling back pain  History of present illness: This lady has a 2-3 year history of back and sciatic pain. Initially was operated on before at L5 S1 disc herniation with left sciatic pain resolved well after the surgery. However that she then developed recurrent disabling axial back pain. Currently has right and left leg pain associated with her back pain, the back pain is worse.  She will underwent a first stage anterior L4-5 L5-S1 fusion several months ago and actually had good relief of her pain at that time. I agreed to be for definitive treatment of her degenerative thoracolumbar scoliosis with a planned second stage posterior fusion, as long as her pain situation was satisfactory. However in the last 2-3 weeks she has developed severe recurrent pain, she has had severe pain rendering her almost immobile. Therefore we have when to go ahead and do what was in fact already planned, a second stage thoracolumbar fusion.  Review of systems and past history-see previous H and P's  Physical examination:  HEENT: Negative, pharynx clear, normal extraocular motion, no cervical lymphadenopathy  CVS: Normal rhythm, no murmurs heard, no peripheral edema  Respiratory: No adventitious breath sounds  Abdomen: Soft nontender  Lower extremities/neurological/musculoskeletal: No loss of motor power, no loss of sensation, straight leg raising 70 bilaterally  Diagnosis more spondylosis, lumbar scoliosis  Discussion for the reasons discussed above, we're going ahead with her thoracolumbar fusion. She has a good knowledge of the possible general and specific complications and understands that this is a large undertaking.

## 2012-09-02 NOTE — Brief Op Note (Signed)
Preop Dx:  Lumbar spondylosis and scoliosis  Postop Dx: same  OP'n: T8 to S1 fusion + bone graft harvest; removal of spinal cord stim electrode.  EBL:>2500 cc; 4 units FFP transfused + cell saver blood

## 2012-09-02 NOTE — Progress Notes (Signed)
Seen in recovery room.  She is conscious and alert and can dorsiflex/pplantarflex both feet and ankles.  I inspected the upper portion of the wound at RN's request and she may be developing a hematoma.  At this point I think the best thing to do is just to keep her on her back as much as possible.  I have consulted critical care and spoke with Dr. Delford Field on the phone.

## 2012-09-02 NOTE — Interval H&P Note (Signed)
No update necessary, H. and P. performed this morning

## 2012-09-02 NOTE — Anesthesia Preprocedure Evaluation (Signed)
Anesthesia Evaluation  Patient identified by MRN, date of birth, ID band Patient awake    Reviewed: Allergy & Precautions, H&P , NPO status , Patient's Chart, lab work & pertinent test results  Airway Mallampati: I TM Distance: >3 FB Neck ROM: full    Dental   Pulmonary former smoker,          Cardiovascular Rhythm:regular Rate:Normal     Neuro/Psych  Neuromuscular disease    GI/Hepatic   Endo/Other    Renal/GU      Musculoskeletal   Abdominal   Peds  Hematology   Anesthesia Other Findings   Reproductive/Obstetrics                           Anesthesia Physical Anesthesia Plan  ASA: I  Anesthesia Plan: General   Post-op Pain Management:    Induction: Intravenous  Airway Management Planned: Oral ETT  Additional Equipment:   Intra-op Plan:   Post-operative Plan: Extubation in OR  Informed Consent: I have reviewed the patients History and Physical, chart, labs and discussed the procedure including the risks, benefits and alternatives for the proposed anesthesia with the patient or authorized representative who has indicated his/her understanding and acceptance.     Plan Discussed with: CRNA, Anesthesiologist and Surgeon  Anesthesia Plan Comments:         Anesthesia Quick Evaluation

## 2012-09-02 NOTE — Preoperative (Signed)
Beta Blockers   Reason not to administer Beta Blockers:Not Applicable, No BB  

## 2012-09-02 NOTE — Anesthesia Postprocedure Evaluation (Signed)
  Anesthesia Post-op Note  Patient: [name]  Procedure(s) Performed: Procedure(s) (LRB) with comments: FUSION POSTERIOR SPINAL MULTILEVEL (N/A) - Thorace-lumbar posterior fusion  Patient Location: PACU  Anesthesia Type:General  Level of Consciousness: awake and alert   Airway and Oxygen Therapy: Patient Spontanous Breathing and Patient connected to nasal cannula oxygen  Post-op Pain: moderate  Post-op Assessment: Post-op Vital signs reviewed, Patient's Cardiovascular Status Stable, Respiratory Function Stable, Patent Airway and No signs of Nausea or vomiting  Post-op Vital Signs: Reviewed and stable  Complications: No apparent anesthesia complications

## 2012-09-02 NOTE — Progress Notes (Signed)
Patient assisted to sit up, states she needs to sit up further "so she can cough".  Noted Hematoma appearing structure on her upper back,  Dr. Alveda Reasons notified.

## 2012-09-02 NOTE — Transfer of Care (Signed)
Immediate Anesthesia Transfer of Care Note  Patient: [name]  Procedure(s) Performed: Procedure(s) (LRB) with comments: FUSION POSTERIOR SPINAL MULTILEVEL (N/A) - Thorace-lumbar posterior fusion  Patient Location: PACU  Anesthesia Type:General  Level of Consciousness: sedated  Airway & Oxygen Therapy: Patient Spontanous Breathing and Patient connected to face mask oxygen  Post-op Assessment: Report given to PACU RN and Post -op Vital signs reviewed and stable  Post vital signs: Reviewed and stable  Complications: No apparent anesthesia complications

## 2012-09-03 DIAGNOSIS — G8918 Other acute postprocedural pain: Secondary | ICD-10-CM | POA: Diagnosis not present

## 2012-09-03 LAB — BASIC METABOLIC PANEL
BUN: 7 mg/dL (ref 6–23)
Calcium: 8 mg/dL — ABNORMAL LOW (ref 8.4–10.5)
GFR calc Af Amer: >90 mL/min (ref >90)
GFR calc non Af Amer: >90 mL/min (ref >90)
Glucose, Bld: 126 mg/dL — ABNORMAL HIGH (ref 70–99)
Sodium: 137 mEq/L (ref 135–145)

## 2012-09-03 LAB — PREPARE FRESH FROZEN PLASMA
Unit division: 0
Unit division: 0
Unit division: 0

## 2012-09-03 LAB — CBC
Hemoglobin: 10.8 g/dL — ABNORMAL LOW (ref 12.0–15.0)
MCH: 30.7 pg (ref 26.0–34.0)
MCHC: 33.5 g/dL (ref 30.0–36.0)
RDW: 14.2 % (ref 11.5–15.5)

## 2012-09-03 MED ORDER — DIPHENHYDRAMINE HCL 50 MG/ML IJ SOLN
25.0000 mg | Freq: Four times a day (QID) | INTRAMUSCULAR | Status: DC | PRN
  Administered 2012-09-03 – 2012-09-04 (×5): 25 mg via INTRAVENOUS
  Filled 2012-09-03 (×3): qty 1

## 2012-09-03 MED ORDER — DIPHENHYDRAMINE HCL 50 MG/ML IJ SOLN: 12.5000 mg | Freq: Four times a day (QID) | INTRAMUSCULAR | Status: DC | PRN

## 2012-09-03 MED ORDER — HYDROMORPHONE 0.3 MG/ML IV SOLN
INTRAVENOUS | Status: DC
  Administered 2012-09-03: 0.6 mg via INTRAVENOUS
  Administered 2012-09-03: 08:00:00 via INTRAVENOUS
  Administered 2012-09-03: 1.89 mg via INTRAVENOUS
  Administered 2012-09-03: 1.19 mg via INTRAVENOUS
  Administered 2012-09-04: 0.99 mg via INTRAVENOUS
  Administered 2012-09-04: 0.6 mg via INTRAVENOUS
  Filled 2012-09-03: qty 25

## 2012-09-03 MED ORDER — FENTANYL CITRATE 0.05 MG/ML IJ SOLN
50.0000 ug | INTRAMUSCULAR | Status: DC | PRN
  Administered 2012-09-03 (×3): 100 ug via INTRAVENOUS
  Filled 2012-09-03 (×3): qty 2

## 2012-09-03 MED ORDER — ONDANSETRON HCL 4 MG/2ML IJ SOLN: 4.0000 mg | Freq: Four times a day (QID) | INTRAMUSCULAR | Status: DC | PRN

## 2012-09-03 MED ORDER — NALOXONE HCL 0.4 MG/ML IJ SOLN: 0.4000 mg | INTRAMUSCULAR | Status: DC | PRN

## 2012-09-03 MED ORDER — DIPHENHYDRAMINE HCL 12.5 MG/5ML PO ELIX
12.5000 mg | ORAL_SOLUTION | Freq: Four times a day (QID) | ORAL | Status: DC | PRN
  Filled 2012-09-03: qty 5

## 2012-09-03 MED ORDER — SODIUM CHLORIDE 0.9 % IJ SOLN: 9.0000 mL | INTRAMUSCULAR | Status: DC | PRN

## 2012-09-03 MED ORDER — HYDROCODONE-ACETAMINOPHEN 10-325 MG PO TABS
1.0000 | ORAL_TABLET | ORAL | Status: DC | PRN
  Administered 2012-09-03 – 2012-09-10 (×52): 1 via ORAL
  Filled 2012-09-03 (×5): qty 1
  Filled 2012-09-03: qty 2
  Filled 2012-09-03 (×46): qty 1

## 2012-09-03 NOTE — Progress Notes (Signed)
Seen in room 2311  S: co inadequate pain control on PCA - according to RN was switched from dilaudid to fentanyl because of itching and back to dilaudid because patient thinks fentanyl was inferior for pain control.  Both meds made her itch which seems reasonably well controlled with IV benadryl.  Oral NOrco seems to help, but NOrco10, 1 q4h has not been holding.  NO complaints of leg pain  O:  Quite drowsy.  Right leg seems globally week diminished strength definitely in quads and TA, but no altered sensation.  QJ's are 1-2 bilaterally and they are symetric.  AJ's not elicitable bilaterally.  Babinski negative bilaterally, no clonus  A: pain control is a problem, right LE weakness cause unclear - there wer no intraop problems and all emg testing of screws was normal  P: CT to evaluate fixation position, Norco10 q2 h

## 2012-09-03 NOTE — Progress Notes (Signed)
Attempted ambulation.  Pt able to stand at bedside but experienced R>L LE weakness.  Pt returned to bed, will attempt ambulation at a later time.

## 2012-09-03 NOTE — Evaluation (Signed)
Physical Therapy Evaluation Patient Details Name: [name] MRN: [id number] DOB: [birth year] Today's Date: 09/03/2012 Time: 7829-5621 PT Time Calculation (min): 28 min  PT Assessment / Plan / Recommendation Clinical Impression  Pt. admitted s/p T8-S1 fusion + bone graft harvest and removal of spinal cord stim electrode; Previous anterior lumbar fusion of L4-L5, L5-S1 on 06/28/12. Pt. would benefit from acute PT to address mobility and activity tolerance so pt. may return to PLOF and return home safely with husband. Pt. with 10/10 pain in back and itching throughout tx. However, pt. willing to perform all mobility asked of her and did not complain of increased pain with ambulation. Pts. R knee buckling in stance with ambulation and needing assistance to advance RLE. Clarified mobility status with RN prior to eval. Before PT entered room BP map was 57 but after RN retake BP map 66. Pt able to recall back precautions from previous surgery.    PT Assessment  Patient needs continued PT services    Follow Up Recommendations  Supervision for mobility/OOB;CIR       Barriers to Discharge None      Equipment Recommendations  None recommended by PT       Frequency Min 5X/week    Precautions / Restrictions Precautions Precautions: Fall;Back Precaution Booklet Issued: Yes (comment) Restrictions Weight Bearing Restrictions: No   Pertinent Vitals/Pain BP 105/52 (66) when PT entered room, 117/82 (90) at end of eval HR 103 O2 95% on room air      Mobility  Bed Mobility Bed Mobility: Left Sidelying to Sit;Sitting - Scoot to Edge of Bed Left Sidelying to Sit: 4: Min assist;HOB flat Sitting - Scoot to Edge of Bed: 3: Mod assist Details for Bed Mobility Assistance: Pt. required verbal cueing for sequencing and tactile cueing for LE movement. Physical assist to achieve upright. Draw pad used to assist with scooting EOB. Educated on back precautions with bed mobility. Transfers Transfers:  Sit to Stand;Stand to Sit Sit to Stand: From bed;4: Min assist Stand to Sit: To chair/3-in-1;With armrests;4: Min assist Details for Transfer Assistance: Performed from elevated surface. Pt. educated to follow back precautions when standing; pt. flexed trunk. Ambulation/Gait Ambulation/Gait Assistance: 1: +2 Total assist Ambulation/Gait: Patient Percentage: 80% Ambulation Distance (Feet): 30 Feet Assistive device: Rolling walker Ambulation/Gait Assistance Details: Pt. required verbal and tactile cueing for sequencing. Physical assist to advance RLE and to block R knee when in stance due to buckling. Pt. unsteady at times lifting one side of the walker off the floor; physical assist required to correct LOB and pt. cued to take her time. Cueing to maintain upright posture. Gait Pattern: Step-to pattern;Decreased stride length;Trunk flexed;Decreased step length - right;Right flexed knee in stance Gait velocity: decreased Stairs: No           PT Diagnosis: Difficulty walking;Generalized weakness  PT Problem List: Decreased strength;Decreased activity tolerance;Decreased mobility PT Treatment Interventions: DME instruction;Gait training;Stair training;Functional mobility training;Therapeutic activities;Therapeutic exercise;Balance training;Patient/family education   PT Goals Acute Rehab PT Goals PT Goal Formulation: With patient Time For Goal Achievement: 09/10/12 Potential to Achieve Goals: Good Pt will go Supine/Side to Sit: with modified independence PT Goal: Supine/Side to Sit - Progress: Goal set today Pt will go Sit to Supine/Side: with modified independence PT Goal: Sit to Supine/Side - Progress: Goal set today Pt will go Sit to Stand: with modified independence PT Goal: Sit to Stand - Progress: Goal set today Pt will go Stand to Sit: with modified independence PT Goal: Stand to Sit -  Progress: Goal set today Pt will Ambulate: 51 - 150 feet;with rolling walker;with mod assist PT  Goal: Ambulate - Progress: Goal set today Pt will Perform Home Exercise Program: Independently PT Goal: Perform Home Exercise Program - Progress: Goal set today  Visit Information  Last PT Received On: 09/03/12 Assistance Needed: +2    Subjective Data  Subjective: "I itch and my pain medicine is not working." Patient Stated Goal: Retrun to swimming   Prior Functioning  Home Living Lives With: Spouse Available Help at Discharge: Family;Available PRN/intermittently Type of Home: House Home Access: Stairs to enter [employer] of Steps: 4 Entrance Stairs-Rails: Left Home Layout: One level Bathroom Shower/Tub: Walk-in shower;Tub only;Tub/shower unit Bathroom Toilet: Handicapped height Home Adaptive Equipment: Hand-held shower hose;Built-in shower seat;Walker - rolling;Straight cane;Bedside commode/3-in-1 Prior Function Level of Independence: Independent with assistive device(s) Able to Take Stairs?: Yes Driving: Yes Vocation: On disability Communication Communication: No difficulties    Cognition  Overall Cognitive Status: Appears within functional limits for tasks assessed/performed Arousal/Alertness: Awake/alert Orientation Level: Appears intact for tasks assessed Behavior During Session: [facility] for tasks performed    Extremity/Trunk Assessment Right Upper Extremity Assessment RUE ROM/Strength/Tone: [facility] for tasks assessed Left Upper Extremity Assessment LUE ROM/Strength/Tone: WFL for tasks assessed Right Lower Extremity Assessment RLE ROM/Strength/Tone: Deficits RLE ROM/Strength/Tone Deficits: Knee buckles with ambulation; hip flexion 2/5; knee extension 2/5; ankle dorsiflexors 2/5 Left Lower Extremity Assessment LLE ROM/Strength/Tone: WFL for tasks assessed      End of Session PT - End of Session Equipment Utilized During Treatment: Gait belt Activity Tolerance: Patient tolerated treatment well Patient left: in chair;with call bell/phone within reach;with  nursing in room Nurse Communication: Mobility status    Army Chaco SPT 09/03/2012, 10:05 AM

## 2012-09-03 NOTE — Progress Notes (Signed)
Name: [name] MRN: [id number] DOB: 1956/04/25    LOS: 1  PULMONARY / CRITICAL CARE MEDICINE  HPI:   56 years old female with no significant PMH except for chronic back pain of about two years secondary to lumbar scoliosis and spondylosis. She has history of a previous back surgery for an L5-S1 disc herniation with temporary resolution of her pain. Now she has recurrent back pain that is disabling. Today was admitted electively for a T8-S1 fusion + bone graft harvest and removal of spinal cord stim electrode. She underwent her surgery without apparent immediate complications. She is extubated, awake and alert. EBL per note was >2500cc with cell saver and received 4 units of FFP. Critical care consult called by Dr. Alveda Reasons for medical management.      SUBJECTIVE/OVERNIGHT/INTERVAL HX - in bed supine due to pain. REceived fent in 4h. Pain not relived is between 5-9 at all times. Fentanyl causing itching. She prefers PCA when offered choice  Vital Signs: Temp:  [98.2 F (36.8 C)-99.2 F (37.3 C)] 98.5 F (36.9 C) (11/30 0749) Pulse Rate:  [85-108] 91  (11/30 0800) Resp:  [10-31] 20  (11/30 0800) BP: (92-130)/(43-85) 100/43 mmHg (11/30 0800) SpO2:  [92 %-100 %] 96 % (11/30 0800)  Physical Examination: General:  Obese, Pleasant female patient in no acute distress. Lying supine in bed due to pain Neuro:  Awake, alert, nonfocal but in PAIN  - score 5/10 HEENT:  PERRL, pink conjunctivae, moist membranes Neck:  Supple, no JVD   Cardiovascular:  RRR, no M/R/G Lungs:  CTA Abdomen:  Soft, nontender, nondistended, bowel sounds present Musculoskeletal:  Moves all extremities, no pedal edema. Surgical wound dressed. Small hematoma on the upper part of the wound was reported. On exam looks small in size. JP and hemovac drainage in place.  Skin:  No rash  Laboratory data:    Lab 09/03/12 0540 09/02/12 1901 09/02/12 1540  HGB 10.8* 10.8* 9.5*  HCT 32.2* 32.1* 28.0*  WBC 7.8 7.2 --    PLT 95* 93* --    Lab 09/03/12 0540 09/02/12 1901 09/02/12 1540 09/02/12 1312  NA 137 138 139 139  K 3.8 3.9 -- --  CL 101 103 -- --  CO2 29 27 -- --  GLUCOSE 126* 129* 97 106*  BUN 7 9 -- --  CREATININE 0.68 0.63 -- --  CALCIUM 8.0* 7.9* -- --  MG -- -- -- --  PHOS -- -- -- --    No results found for this basename: TROPONINI:5 in the last 168 hours No results found for this basename: PROCALCITON:5 in the last 168 hours No results found for this basename: PHART:5,PCO2:5,PCO2ART:5,PO2:5,PO2ART:5,HCO3:5,TCO2:5,O2SAT:5 in the last 168 hours No results found for this basename: PROBNP:5 in the last 168 hours  Dg Thoracolumbar Erect  09/02/2012  *RADIOLOGY REPORT*  Clinical Data: Preop posterior fusion.  THORACOLUMBAR SCOLIOSIS STUDY - STANDING VIEWS  Comparison: Intraoperative lumbar images 06/28/2012.  Chest x-ray 07/03/2011.  Findings: Spinal stimulator device in place, entering the spinal canal at the T12-L1 level.  The tips are at the T8 level.  Prior anterior fusion at L4-5 and L5-S1.  Mild levoscoliosis in the upper to mid lumbar spine.  Mild degenerative spurring in the thoracolumbar spine.  No fracture or acute subluxation.  IMPRESSION: Postoperative changes in the lower lumbar spine.  Mild lumbar levoscoliosis.  Spinal stimulator as above.   Original Report Authenticated By: Charlett Nose, M.D.    Dg Lumbar Spine Complete  09/02/2012  *RADIOLOGY  REPORT*  Clinical Data: Lumbar disc disease.  LUMBAR SPINE - COMPLETE 4+ VIEW  Comparison: Radiographs dated 06/28/2012 and 11/11/2010  Findings: Radiograph #6 appears essentially unchanged since the prior exam except for repositioning of an instrument. The AP view demonstrates lamina hooks at T11, T9, T8, and T7. Images 8 and 9 demonstrate that rods have been inserted through the lamina hooks and pedicle screws.  Images 10 and 11 in the PA projection demonstrate fusion from T7-S1 with bilateral sacroiliac joint fusion.  IMPRESSION: Fusion  performed from T7-S1.   Original Report Authenticated By: Francene Boyers, M.D.    Dg Lumbar Spine Complete  09/02/2012  *RADIOLOGY REPORT*  Clinical Data: Lumbar disc disease.  LUMBAR SPINE - COMPLETE 4+ VIEW  Comparison: Radiographs dated 06/28/2012 and 11/11/2010  Findings: There is #1 demonstrates an instrument at the L2-3 level. Radiograph #2 also demonstrates an instrument at the L2-3 level. Radiograph #3 demonstrates pedicle screws extending from L1-S1. Radiograph #4 also demonstrates the pedicle screws. There is a screw at S2 extending through the sacroiliac joint. Radiograph #5 appears unchanged.  IMPRESSION: The pedicle screws inserted from L1-S1 and one screw visible through the sacroiliac joint.   Original Report Authenticated By: Francene Boyers, M.D.      Patient Active Problem List  Diagnosis  . Postoperative pain     ASSESSMENT: 56 years old female with no significant PMH except for chronic back pain of about two years secondary to lumbar scoliosis and spondylosis. Presents electively for  T8-S1 fusion + bone graft harvest and removal of spinal cord stim electrode. Now post surgery, extubated, awake, alert, oriented x 3, hemodynamically stable with no focal neurological signs. EBL of >2500, cell saver was used. Received 4 units of FFP.    - 09/03/12: No adverse events from massive transfusion or prolonged surgery. Main issue is severe acute postoperative pain  RECOMMENDATIONS: - start dilaudid PCA (stop fent due to itch) at low dose and based on response adjust dosage - Cardiac monitoring per ICU protocol - Supplemental oxygen via [place] to keep O2 sat >92% - Will continue IVF's at 100cc/hr for today and d/c in the morning. - Incentive spirometry - DVT prophylaxis with SCD's and early ambulation. - Will follow CBC and chemistry in am - Wound care per surgical team. - Ancef per surgical team. - Will advance diet as tolerated. - Continue home medications once tolerating PO. - GI  prophylaxis with IV protonix.    Dr. Kalman Shan, M.D., [facility].C.P Pulmonary and Critical Care Medicine Staff Physician [facility] [place] [facility] Pager: [phone], If no answer or between  15:00h - 7:00h: call [phone]  09/03/2012 8:58 AM

## 2012-09-03 NOTE — Evaluation (Signed)
Seen and agree with SPT note Ishani Goldwasser Tabor Gervis Gaba, PT [phone]  

## 2012-09-04 ENCOUNTER — Inpatient Hospital Stay (HOSPITAL_COMMUNITY): Payer: BC Managed Care – PPO

## 2012-09-04 ENCOUNTER — Encounter (HOSPITAL_COMMUNITY): Payer: Self-pay | Admitting: Internal Medicine

## 2012-09-04 DIAGNOSIS — G8918 Other acute postprocedural pain: Secondary | ICD-10-CM

## 2012-09-04 DIAGNOSIS — K5900 Constipation, unspecified: Secondary | ICD-10-CM

## 2012-09-04 DIAGNOSIS — M549 Dorsalgia, unspecified: Secondary | ICD-10-CM

## 2012-09-04 LAB — BASIC METABOLIC PANEL
BUN: 4 mg/dL — ABNORMAL LOW (ref 6–23)
Chloride: 102 mEq/L (ref 96–112)
Glucose, Bld: 127 mg/dL — ABNORMAL HIGH (ref 70–99)
Potassium: 3.6 mEq/L (ref 3.5–5.1)

## 2012-09-04 MED ORDER — MORPHINE SULFATE (PF) 1 MG/ML IV SOLN: INTRAVENOUS | Status: DC

## 2012-09-04 MED ORDER — ONDANSETRON HCL 4 MG/2ML IJ SOLN: 4.0000 mg | Freq: Four times a day (QID) | INTRAMUSCULAR | Status: DC | PRN

## 2012-09-04 MED ORDER — SODIUM CHLORIDE 0.9 % IJ SOLN: 9.0000 mL | INTRAMUSCULAR | Status: DC | PRN

## 2012-09-04 MED ORDER — NALOXONE HCL 0.4 MG/ML IJ SOLN: 0.4000 mg | INTRAMUSCULAR | Status: DC | PRN

## 2012-09-04 MED ORDER — ACETAMINOPHEN 325 MG PO TABS: 325.0000 mg | ORAL_TABLET | ORAL | Status: DC | PRN

## 2012-09-04 MED ORDER — DIPHENHYDRAMINE HCL 50 MG/ML IJ SOLN: 12.5000 mg | Freq: Four times a day (QID) | INTRAMUSCULAR | Status: DC | PRN

## 2012-09-04 MED ORDER — DIPHENHYDRAMINE HCL 12.5 MG/5ML PO ELIX
25.0000 mg | ORAL_SOLUTION | Freq: Four times a day (QID) | ORAL | Status: DC | PRN
  Administered 2012-09-04: 25 mg via ORAL
  Filled 2012-09-04: qty 10

## 2012-09-04 MED ORDER — HYDROMORPHONE 0.3 MG/ML IV SOLN
INTRAVENOUS | Status: DC
  Administered 2012-09-04: 1.99 mg via INTRAVENOUS
  Administered 2012-09-04: 1.39 mg via INTRAVENOUS
  Filled 2012-09-04: qty 25

## 2012-09-04 MED ORDER — ACETAMINOPHEN 650 MG RE SUPP: 650.0000 mg | RECTAL | Status: DC | PRN

## 2012-09-04 MED ORDER — DIPHENHYDRAMINE HCL 12.5 MG/5ML PO ELIX
12.5000 mg | ORAL_SOLUTION | Freq: Four times a day (QID) | ORAL | Status: DC | PRN
  Filled 2012-09-04 (×2): qty 5

## 2012-09-04 MED ORDER — DIPHENHYDRAMINE HCL 12.5 MG/5ML PO ELIX: 12.5000 mg | ORAL_SOLUTION | Freq: Four times a day (QID) | ORAL | Status: DC | PRN

## 2012-09-04 MED ORDER — MORPHINE SULFATE (PF) 1 MG/ML IV SOLN
INTRAVENOUS | Status: DC
  Administered 2012-09-04: 7 mg via INTRAVENOUS
  Administered 2012-09-04: 16:00:00 via INTRAVENOUS
  Administered 2012-09-05: 7 mg via INTRAVENOUS
  Administered 2012-09-05: 12 mg via INTRAVENOUS
  Administered 2012-09-05 (×2): 4 mg via INTRAVENOUS
  Administered 2012-09-05: 10.96 mg via INTRAVENOUS
  Administered 2012-09-05: 5 mg via INTRAVENOUS
  Administered 2012-09-06: 4 mg via INTRAVENOUS
  Administered 2012-09-06: 21.89 mg via INTRAVENOUS
  Administered 2012-09-06 (×2): 4 mg via INTRAVENOUS
  Administered 2012-09-06: 7 mg via INTRAVENOUS
  Administered 2012-09-06: 08:00:00 via INTRAVENOUS
  Administered 2012-09-07: 10 mg via INTRAVENOUS
  Administered 2012-09-07: 2 mg via INTRAVENOUS
  Administered 2012-09-07: 11 mg via INTRAVENOUS
  Administered 2012-09-07 (×2): 3 mg via INTRAVENOUS
  Filled 2012-09-04 (×6): qty 25

## 2012-09-04 NOTE — Progress Notes (Signed)
Patient ZO:XWRUEA D Roher      DOB: May 04, 1956      VWU:[id number]  Reviewed case with patient and Dr. Alveda Reasons.  Patient states she has best relief of pain with Norco 10/325 q 2 hours.  She does not feel that the dilaudid his helping and in fact is making her itch.  She reports having no difficulty with Morphine in the past and so we agreed to try to switch to Morphine PCA.  We will try a reduced dose PCA in anticipation of possibly transferring to CIR in the AM.  CT scan shows hardware in good position and right foot moving better today.  If half dose pca insufficient will up to full dose, but I think she will likely do fine with half dose.  Full consult to follow.  Beckam Abdulaziz L. Ladona Ridgel, MD MBA The Palliative Medicine Team at [facility] Phone: [phone] Pager: [phone]

## 2012-09-04 NOTE — Op Note (Signed)
Dictation [id number]

## 2012-09-04 NOTE — Progress Notes (Signed)
Name: [name] MRN: [id number] DOB: Sep 30, 1956    LOS: 2  PULMONARY / CRITICAL CARE MEDICINE  HPI:   56 years old female with no significant PMH except for chronic back pain of about two years secondary to lumbar scoliosis and spondylosis. She has history of a previous back surgery for an L5-S1 disc herniation with temporary resolution of her pain. Now she has recurrent back pain that is disabling. Today was admitted electively for a T8-S1 fusion + bone graft harvest and removal of spinal cord stim electrode. She underwent her surgery without apparent immediate complications. She is extubated, awake and alert. EBL per note was >2500cc with cell saver and received 4 units of FFP. Critical care consult called by Dr. Alveda Reasons for medical management.    EVENT 09/03/12 - in bed supine due to pain. REceived fent in 4h. Pain not relived is between 5-9 at all times. Fentanyl causing itching. She prefers PCA when offered choice   SUBJECTIVE/OVERNIGHT/INTERVAL HX 09/04/12: crying due to sever pain. Asking for her scheduled norco which helps pain but states dilaudid lw dose pca not helping  Vital Signs: Temp:  [98.2 F (36.8 C)-99.5 F (37.5 C)] 99.1 F (37.3 C) (12/01 0717) Pulse Rate:  [86-115] 108  (12/01 0800) Resp:  [16-25] 19  (12/01 0800) BP: (89-114)/(28-76) 112/63 mmHg (12/01 0800) SpO2:  [91 %-99 %] 91 % (12/01 0800)  Physical Examination: General:  Obese, Lying recumbnet in chair and cryig in pain Neuro:  Awake, alert,Crying due to severe pain. Asking for her scheduled norco HEENT:  PERRL, pink conjunctivae, moist membranes Neck:  Supple, no JVD   Cardiovascular:  RRR, no M/R/G Lungs:  CTA Abdomen:  Soft, nontender, nondistended, bowel sounds present Musculoskeletal:  . Surgical wound dressed. Small hematoma on the upper part of the wound was reported. OSkin:  No rash  Laboratory data:    Lab 09/04/12 0455 09/03/12 0540 09/02/12 1901  HGB 8.5* 10.8* 10.8*  HCT 25.2*  32.2* 32.1*  WBC 9.2 7.8 7.2  PLT 80* 95* 93*    Lab 09/04/12 0455 09/03/12 0540 09/02/12 1901 09/02/12 1540 09/02/12 1312  NA 138 137 138 139 139  K 3.6 3.8 -- -- --  CL 102 101 103 -- --  CO2 29 29 27  -- --  GLUCOSE 127* 126* 129* 97 106*  BUN 4* 7 9 -- --  CREATININE 0.60 0.68 0.63 -- --  CALCIUM 8.1* 8.0* 7.9* -- --  MG 1.8 -- -- -- --  PHOS 2.3 -- -- -- --    No results found for this basename: TROPONINI:5 in the last 168 hours No results found for this basename: PROCALCITON:5 in the last 168 hours No results found for this basename: PHART:5,PCO2:5,PCO2ART:5,PO2:5,PO2ART:5,HCO3:5,TCO2:5,O2SAT:5 in the last 168 hours No results found for this basename: PROBNP:5 in the last 168 hours  Dg Lumbar Spine Complete  09/02/2012  *RADIOLOGY REPORT*  Clinical Data: Lumbar disc disease.  LUMBAR SPINE - COMPLETE 4+ VIEW  Comparison: Radiographs dated 06/28/2012 and 11/11/2010  Findings: Radiograph #6 appears essentially unchanged since the prior exam except for repositioning of an instrument. The AP view demonstrates lamina hooks at T11, T9, T8, and T7. Images 8 and 9 demonstrate that rods have been inserted through the lamina hooks and pedicle screws.  Images 10 and 11 in the PA projection demonstrate fusion from T7-S1 with bilateral sacroiliac joint fusion.  IMPRESSION: Fusion performed from T7-S1.   Original Report Authenticated By: Francene Boyers, M.D.    Dg  Lumbar Spine Complete  09/02/2012  *RADIOLOGY REPORT*  Clinical Data: Lumbar disc disease.  LUMBAR SPINE - COMPLETE 4+ VIEW  Comparison: Radiographs dated 06/28/2012 and 11/11/2010  Findings: There is #1 demonstrates an instrument at the L2-3 level. Radiograph #2 also demonstrates an instrument at the L2-3 level. Radiograph #3 demonstrates pedicle screws extending from L1-S1. Radiograph #4 also demonstrates the pedicle screws. There is a screw at S2 extending through the sacroiliac joint. Radiograph #5 appears unchanged.  IMPRESSION: The  pedicle screws inserted from L1-S1 and one screw visible through the sacroiliac joint.   Original Report Authenticated By: Francene Boyers, M.D.      Patient Active Problem List  Diagnosis  . Postoperative pain     ASSESSMENT: 56 years old female with no significant PMH except for chronic back pain of about two years secondary to lumbar scoliosis and spondylosis. Presents electively for  T8-S1 fusion + bone graft harvest and removal of spinal cord stim electrode. Now post surgery, extubated, awake, alert, oriented x 3, hemodynamically stable with no focal neurological signs. EBL of >2500, cell saver was used. Received 4 units of FFP.    - 09/03/12: No adverse events from massive transfusion or prolonged surgery. Main issue is severe acute postoperative pain  - 09/04/12: Severe pain with low dose dilaudid pca. Prefers her schedule po norco. Crying due to pain  RECOMMENDATIONS: - increase dilaudid PCA (stop fent due to itch) from low dose to high dose andcall pall care for pain mgmt - Cardiac monitoring per ICU protocol - Supplemental oxygen via [place] to keep O2 sat >92% - Will continue IVF's at 100cc/hr for today and d/c in the morning. - Incentive spirometry - DVT prophylaxis with SCD's and early ambulation. - Will follow CBC and chemistry in am - Wound care per surgical team. - Ancef per surgical team. -  diet as tolerated. - Continue home medications once tolerating PO. - GI prophylaxis with IV protonix.    Dr. Kalman Shan, M.D., [facility].C.P Pulmonary and Critical Care Medicine Staff Physician [facility] [place] [facility] and [facility] Pager: [phone], If no answer or between  15:00h - 7:00h: call [phone]  09/04/2012 9:18 AM

## 2012-09-04 NOTE — Consult Note (Signed)
Patient [name]      DOB: 14-Oct-1955      VWU:[id number]     Consult Note from the Palliative Medicine Team at [facility]    Consult Requested by: Dr. Bonney Aid   PCP: Provider Not In System Reason for Consultation: Symptom management     Phone Number:None  Assessment of patients Current state: Patient is a 56 year old female with a long history of chronic back pain requiring numerous surgical interventions.  She was admitted for elective spinal surgery.  Post op she has had some weakness of the right foot, and pain in her back described at crampy/spasmotic 5-9/10 relieved by taking her Norco.  She reports that she was having intense itching from the Dilaudid and she did not feel that it was helping her. She responded to my query about using morphine in the past.  She states she does not recall it giving her itching, and felt that it helped in the past.  We elected to switch her pca to Morphine half dose and continue the Norco q 2 hrs prn   Goals of Care: 1.  Code Status: Full code   2. Scope of Treatment: Goals are to control pain so that she can participate in physical therapy and transition to CIR hopefully in the am.  4. Disposition: CIR placement   3. Symptom Management:   1. Pain: Patient knows that her pain responds to Norco 10/325 mg q 2 hours and prefers to use that as her primary pain management . She recalls that she has used Morphine in the past and did not recall it giving her itching.  We will, therefore, switch to a Morphine 1/2 dose pca.  We can increase to a full dose pca if it is not fully effective.  Agree with continuing Neurontin. 2. Bowel Regimen: currently she has not passed flatus but is tolerating a liquid diet.  She is on colace and senna. Miralax can be added if she does not progress. 3. Fever: caution should be used with adding tylenol to her Norco as she is already taking 3. 9 grams if she uses the meds every 2 hours. 4. Nausea/Vomiting: none  reported    Patient Documents Completed or Given: Document Given Completed  Advanced Directives Pkt    MOST    DNR    Gone from My Sight    Hard Choices      Brief HPI: 56 yr old s/p first stage L4-L5  And L5-S1 interbody fusions and prior lumbar laminectomy.  Patient was noted to be having exacerbation of her pain yesterday with some weakness in her right leg.  This am as well she was tearful and uncomfortable.  She had been started on diluadid pca which was increased today but was not as effective as her norco q 2 hrs .  She also was suffering from severe itching related to the dilaudid. I was asked to assist with pain control.   ROS:  Spasms in her back,  No nausea or vomiting.  Less weakness in her legs, no bowel movement since Thursday.    PMH:  Past Medical History  Diagnosis Date  . Peripheral neuropathy   . Joint pain   . Joint swelling   . Scoliosis   . Psoriasis   . Spastic bladder     sporadic  . Menopause     takes Wellbutrin bid  . Arthritis     back   . Chronic lower back pain  PSH: Past Surgical History  Procedure Date  . Gum surgery 06/2012    "created flap for upper bridge, all across front"  . Laminectomy 11/2010  . Colonoscopy   . Esophagogastroduodenoscopy   . Anterior lumbar fusion 89/24/2013    "cages"  . Anterior fusion cervical spine 2000  . Back surgery   . Refractive surgery ~ 2010    bilaterally  . Spinal cord stimulator insertion 07/2011  . Cholecystectomy 1990's  . Abdominal hysterectomy 1990's  . Appendectomy 1990's    "think they did when they did hysterectomy"  . Anterior lumbar fusion 06/28/2012    Procedure: ANTERIOR LUMBAR FUSION 2 LEVELS;  Surgeon: Mat Carne, MD;  Location: [facility] OR;  Service: Orthopedics;  Laterality: Bilateral;  1st stage anterior interbody fusion L4-5/L5-S1   I have reviewed the FH and SH and  If appropriate update it with new information. Allergies  Allergen Reactions  . Sulfa Antibiotics  Nausea Only   Scheduled Meds:   . buPROPion  150 mg Oral BID  . docusate sodium  100 mg Oral BID  . estradiol  2 mg Oral Daily  . gabapentin  600 mg Oral TID  . HYDROmorphone PCA 0.3 mg/mL   Intravenous Q4H  . pantoprazole (PROTONIX) IV  40 mg Intravenous QHS  . senna  1 tablet Oral BID  . sodium chloride  3 mL Intravenous Q12H  . vitamin C  500 mg Oral BID  . [DISCONTINUED] HYDROmorphone PCA 0.3 mg/mL   Intravenous Q4H   Continuous Infusions:   . sodium chloride    . dextrose 5 % and 0.45 % NaCl with KCl 20 mEq/L 100 mL/hr at 09/03/12 1637   PRN Meds:.acetaminophen, acetaminophen, bisacodyl, diphenhydrAMINE, diphenhydrAMINE, diphenhydrAMINE, HYDROcodone-acetaminophen, loratadine, menthol-cetylpyridinium, naloxone, ondansetron (ZOFRAN) IV, ondansetron (ZOFRAN) IV, phenol, sodium chloride, sodium chloride, [DISCONTINUED] diphenhydrAMINE, [DISCONTINUED] diphenhydrAMINE, [DISCONTINUED] HYDROcodone-acetaminophen, [DISCONTINUED] naloxone, [DISCONTINUED] ondansetron (ZOFRAN) IV [DISCONTINUED] sodium chloride    BP 113/62  Pulse 106  Temp 98.2 F (36.8 C) (Oral)  Resp 17  SpO2 97%   PPS: 80 % prior   Intake/Output Summary (Last 24 hours) at 09/04/12 1403 Last data filed at 09/04/12 1300  Gross per 24 hour  Intake 3642.56 ml  Output   2870 ml  Net 772.56 ml   LBM: thursday                      Stool Softner: colace and senna in place  Physical Exam:  General: mild distress secondary to spasm HEENT: PERRL, EOMI, anicteric, mm dry Chest:  Decreased but clear CVS: regular, S1, and S2 Abdomen:firm, no bowel sounds, not tender Ext: warm with better movement of the right leg, trace lower ext edema Neuro: awake and alert, oriented , mild weakness lower ext  Labs: CBC    Component Value Date/Time   WBC 9.2 09/04/2012 0455   RBC 2.72* 09/04/2012 0455   HGB 8.5* 09/04/2012 0455   HCT 25.2* 09/04/2012 0455   PLT 80* 09/04/2012 0455   MCV 92.6 09/04/2012 0455   MCH 31.3  09/04/2012 0455   MCHC 33.7 09/04/2012 0455   RDW 14.2 09/04/2012 0455   LYMPHSABS 2.3 07/03/2011 1156   MONOABS 0.4 07/03/2011 1156   EOSABS 0.2 07/03/2011 1156   BASOSABS 0.0 07/03/2011 1156      CMP     Component Value Date/Time   NA 138 09/04/2012 0455   K 3.6 09/04/2012 0455   CL 102 09/04/2012 0455   CO2 29 09/04/2012  0455   GLUCOSE 127* 09/04/2012 0455   BUN 4* 09/04/2012 0455   CREATININE 0.60 09/04/2012 0455   CALCIUM 8.1* 09/04/2012 0455   PROT 7.1 08/26/2012 0921   ALBUMIN 3.5 08/26/2012 0921   AST 19 08/26/2012 0921   ALT 18 08/26/2012 0921   ALKPHOS 82 08/26/2012 0921   BILITOT 0.4 08/26/2012 0921   GFRNONAA >90 09/04/2012 0455   GFRAA >90 09/04/2012 0455    CT scan of the Back Reviewed/Impressions: hardware intact    Time In Time Out Total Time Spent with Patient Total Overall Time  205 pm 245 pm 40  min 40 min    Greater than 50%  of this time was spent counseling and coordinating care related to the above assessment and plan.  Discussed with Dr. Cameron Ali L. Ladona Ridgel, MD MBA The Palliative Medicine Team at [facility] Phone: [phone] Pager: [phone]

## 2012-09-04 NOTE — Progress Notes (Signed)
Physical Therapy Treatment Patient Details Name: [name] MRN: [id number] DOB: [birth year] Today's Date: 09/04/2012 Time: 4098-1191 PT Time Calculation (min): 26 min  PT Assessment / Plan / Recommendation Comments on Treatment Session  Improved activity tolerance and gait today.  Able to advance RLE during gait.      Follow Up Recommendations  CIR     Does the patient have the potential to tolerate intense rehabilitation     Barriers to Discharge        Equipment Recommendations  None recommended by PT    Recommendations for Other Services    Frequency Min 5X/week   Plan Discharge plan remains appropriate;Frequency remains appropriate    Precautions / Restrictions Precautions Precautions: Fall;Back Restrictions Weight Bearing Restrictions: No   Pertinent Vitals/Pain     Mobility  Bed Mobility Bed Mobility: Rolling Left;Left Sidelying to Sit;Sit to Sidelying Left Rolling Left: 3: Mod assist Left Sidelying to Sit: 3: Mod assist;HOB elevated Sitting - Scoot to Edge of Bed: 4: Min assist Sit to Sidelying Left: 3: Mod assist;HOB flat Details for Bed Mobility Assistance: Used bed pad to assist with rolling - minimal reaching with RUE to rail.  Patient then elevated bed to come to sitting, requiring mod assist to lift trunk.  Reports she is not able to use UE's to help come to sitting.  Mod assist to bring LE's onto bed when returning to sidelying. Transfers Transfers: Sit to Stand;Stand to Sit Sit to Stand: 4: Min assist;With upper extremity assist;From bed Stand to Sit: 4: Min assist;With upper extremity assist;To bed Details for Transfer Assistance: Patient attempted to stand using forearms on RW.  Instructed patient to push up from bed to stand to maintain back precautions. Ambulation/Gait Ambulation/Gait Assistance: 1: +2 Total assist (+1 for lines/chair) Ambulation/Gait: Patient Percentage: 80% Ambulation Distance (Feet): 92 Feet Assistive device: Rolling  walker Ambulation/Gait Assistance Details: Verbal cues for sequencing.  Patient able to advance RLE independently during gait.  Did have 2 episodes of Rt. knee buckling.  Overall improved distance and gait pattern.  Cuing to maintain upright posture. Gait Pattern: Step-through pattern;Decreased stance time - right;Decreased stride length;Decreased dorsiflexion - right;Right steppage Gait velocity: decreased      PT Goals Acute Rehab PT Goals PT Goal: Supine/Side to Sit - Progress: Progressing toward goal PT Goal: Sit to Supine/Side - Progress: Progressing toward goal PT Goal: Sit to Stand - Progress: Progressing toward goal PT Goal: Stand to Sit - Progress: Progressing toward goal PT Goal: Ambulate - Progress: Progressing toward goal  Visit Information  Last PT Received On: 09/04/12 Assistance Needed: +2    Subjective Data  Subjective: "I can't use my arms to help sit up"   Cognition  Overall Cognitive Status: Appears within functional limits for tasks assessed/performed Arousal/Alertness: Awake/alert Orientation Level: Appears intact for tasks assessed Behavior During Session: [facility] for tasks performed    Balance     End of Session PT - End of Session Equipment Utilized During Treatment: Gait belt Activity Tolerance: Patient limited by fatigue;Patient limited by pain Patient left: in bed;with call bell/phone within reach;with family/visitor present Nurse Communication: Mobility status   GP     Vena Austria 09/04/2012, 12:59 PM Durenda Hurt. Renaldo Fiddler, [facility] Acute Rehab Services Pager [phone]

## 2012-09-04 NOTE — Op Note (Signed)
[name], [name] NO.:  [id number]  MEDICAL RECORD NO.:  [id number]  LOCATION:  2311                         FACILITY:  [facility]  PHYSICIAN:  Nelda Severe, MD      DATE OF BIRTH:  [birth year]  DATE OF PROCEDURE:  09/02/2012 DATE OF DISCHARGE:                              OPERATIVE REPORT   SURGEON:  Nelda Severe, MD  ASSISTANT:  Patrick Jupiter, RNFA  PREOPERATIVE DIAGNOSES:  Lumbar spondylosis, thoracolumbar scoliosis, status post first stage L4-5 and L5-S1 anterior interbody fusions, status post prior lumbar laminectomy.  POSTOPERATIVE DIAGNOSES:  Lumbar spondylosis, thoracolumbar scoliosis, status post first stage L4-5 and L5-S1 anterior interbody fusions, status post prior lumbar laminectomy.  OPERATIVE PROCEDURE:  Thoracolumbar fusion with pedicle screw lumbar fixation, fixation in the thoracic spine, bilateral iliac fixation with thoracolumbar rod, ilial lumbar rods, right posterior iliac crest harvest, grabbed using autogenous iliac crest, local bone and OP-1.  CLINICAL NOTE:  This woman had a previous anterior L4-5, L5-S1 fusion. Has a first stage planned thoracolumbar fusion for painful spondylotic condition associated with degenerative scoliosis superimposed on idiopathic scoliosis.  After the first stage lumbar procedures, she had good relief of pain, but unfortunately started to have a significant amount of axial pain recently and although she had hopes to defer posterior thoracolumbar fusion for a longer period of time, the situation with regards to pain and mobility worsened and we therefore decided to proceed as per our original plan.  OPERATIVE NOTE:  The patient was placed under general endotracheal anesthesia.  Foley catheter was placed in the bladder.  Intravenous antibiotics were administered prophylactically.  Sequential compression devices were placed on both lower extremities.  She was positioned prone on the [place] frame.  Care  was taken to position the upper extremities so as to avoid hyperflexion and abduction of shoulders and so as to avoid hyperflexion of the elbows.  Hips, knees, thighs, ankles, etc., were supported on pillows.  A skin marker was used to mark the site of the intended incision, including the incisions for previous spinal cord stimulator insertion and previous laminectomies were performed.  The thoracolumbar area was prepped with DuraPrep and draped in rectangular fashion.  Drapes were secured with Ioban.  Prior to positioning etc., neuromonitoring technician attached electrodes to upper and lower extremities and scalp.  A time-out was held during which the usual information was discussed/confirmed.  We scored the skin in elliptical fashion around the prior thoracic laminectomy incision for insertion of electrodes.  The subcutaneous tissue was injected with a mixture of 0.25% plain Marcaine and 1% lidocaine with epinephrine, as was the tissue underlying both incisions from mid thoracic spine to sacrum.  We then excised the thoracic laminectomy scar elliptically with sharp dissection using a scalpel.  As we came down the spinous processes, I could identify the Ethibond suture used to secure the spinal cord stimulator electrodes to the spinous process at their exit point from the spinal canal.  In the course of carefully identifying the lamina above and below the laminectomy defect and in defining the edge of the upper laminectomy defect, one of the electrodes was damaged with a Kerrison rongeur.  The electrodes were removed easily.  I amputated the ends at this point and felt, however, that we should not do anything but the pulse generator, due to the fact that it is functional and although hopefully will not need it in the future if may be needed.  In the meantime, I was informed by the neuromonitoring technicians that the pulse generator was turned on and we obtained the remote device  from the patient's friend who was in the waiting area and turned it off.  At this time, the neuromonitoring technician was able to resume monitoring, which had been concluded by the pulse generator being on.  Also at this point, I began to use electrocautery for the exposure.  We incised the skin throughout the lumbar portion of the wound sharply and then used cutting current to divide the subcutaneous layer and detached the paraspinal muscles bilaterally.  Therefore at this point, having exposed the lumbar spine from L1 through S1, we started the process of placing pedicle screws. This was done first in the left side working distally from S1, proximally to L1.  The holes were made in the usual fashion by defining the junction of the superior articular process and the transverse process (sacrum, junction of ala and superior articular process), removing a small amount of bone to reveal a posterior end of the pedicle perforating with an awl, using the pedicle probe to make a hole through the pedicle into the vertebral body, sounding the hole for depth, palpating circumferentially to make sure it was intact, tapping it 1 size under and then inserting screw of appropriate length.  A 6.5 mm diameter screws were used at L1 through L5 and 7.5 mm diameter screws were used at the sacrum.  The same procedure was carried out on both sides.  We then extended the incision proximally to the T7-T8 level.  Paraspinal muscles were reflected bilaterally, mobilized off the lamina and transverse process of the thoracic vertebrae.  Next down facing hooks were placed over the upper most lamina and down facing hook placed at the next lower lamina.  This was done by using a high-speed bur to thin out the lamina from the midline at a distance of about 6 mm laterally and then using a 2-mm Kerrison rongeur to complete the small laminotomy and provide place to place the down facing hook. Down facing hooks were  then placed at 2 levels, the most proximal 2 levels and up facing pedicle hooks placed at approximately T10 and T11. The same procedure was carried out on the left side.  Prior to closing the thoracic spine, we had actually inserted iliac screws bilaterally by closing the posterior iliac crest, creating a notch in the crest and inserting 7.5 mm diameter screws of appropriate length into the interval between the outer and inner tables of the ilium and we had attached side connectors to these for ultimate attachment of an iliolumbar rod.  Once the thoracic hooks were placed, we contoured a rod for the left side of the construct from S1 through T7-8 level and then contoured another rod which was going to run parallel to it and the parallel connected from its side connector which extended from the iliac screw. Once these rods were contoured, they were removed.  I then repaired the fusion bed using high-speed bur to bur superficially the lamina of the thoracic spine, the thoracic transverse processes, and to resect the deep articular surfaces of the facet joints at L1-2 to L5-S1.  Local  bone was mixed with autogenous iliac crest bone, which had been harvested at the same time that we exposed the right posterior iliac crest for iliac screw insertion and mixed with OP-1.  This was then placed posteriorly on the left side throughout the length of the fusion with particular attention to impressing it into the resected facet joints at all levels.  The rods were then placed back in position and provisionally fixed with set screws at all levels including the iliolumbar rod attached by 2 parallel rod connectors to the more laterally placed long rod.  Next we went to the right side of the table and essentially performed the same maneuver, 2 rods, 1 from the proximal thoracic spine to S1 and second from the iliac screw side connector into the mid to upper lumbar spine, connected by side  connectors.  Cross-table and AP radiographs were taken which was satisfactory.  At this point, all couplings were torqued.  Prior to final placement of rods on the right side, similar procedure was carried out decorticating the fusion bed and resecting facet joint articular cartilage and placing the mixture of autogenous bone and OP-1.  Not mentioned above is the fact that every screw was tested for EMG activity distally and in all cases the level of current was greater than 20 milliamps to elicit distal EMG activity.  For some reason, motor- evoked potentials were never possible throughout the whole case and the technician felt that was most likely an anesthesia problem, although the anesthesiology team was using a minimal amount of anesthesia which would interfere with electrodiagnostic testing.  At a point in the procedure when we had lost 1200 mL of blood, we did order 2 units of fresh-frozen plasma which were infused and another 2 units were infused when the patient's blood loss was some where around the 2000 mark but with more blood loss expected because of the fact we had another 2-3 hours left in the procedure.  Having taken the final x-rays, torqued all of the couplings, we placed strips of Gelfoam over the graft to try to prevent the graft from migrating into the wound.  We had Stimulan beads prepared with gentamicin and vancomycin, which were placed in the wound over the strips of Gelfoam.  A 15-gauge Blake drain was placed subfascially throughout the length of the wound and brought out through the skin to the right side which was secured with 2-0 nylon suture.  The thoracolumbar fascia was then closed using #1 Vicryl suture in interrupted fashion.  The fascia was closed at the iliac crest graft harvest sites on the right side with interrupted #1 Vicryl suture.  An 8- inch Hemovac drain was placed in the subcutaneous layer and brought out through the skin to the right side  where it was also secured with 2-0 nylon suture.  Subcutaneous layer which was moderately thick was closed with 2-0 and 3-0 interrupted Vicryl sutures inverted.  The skin was then closed with staples.  Nonadherent antibiotic ointment dressing was then applied.  The procedure lasted about 9 hours.  Blood loss was estimated at about 2500 mL.  There were no intraoperative complications.  The sponge and needle counts were correct.  In the recovery room, the patient was able to actively dorsiflex and plantar flex both feet and ankles.     Nelda Severe, MD     MT/MEDQ  D:  09/04/2012  T:  09/04/2012  Job:  [id number]

## 2012-09-04 NOTE — Progress Notes (Signed)
Seen in room 2311  S: much better than yesterday, appears comfortable  O:  Good strength both LE's today;  CT scan shows not mechanical problems  A: improved  P: hopefully transfer to 5 N tomorrow.

## 2012-09-04 NOTE — Progress Notes (Signed)
Patient QI:[id number] [name]      DOB: 02/05/1956      ZDG:[id number]  Consult request received from Dr. Catalina Pizza regarding symptom management.   Arrived to find patient working with PT in no acute distress.  Spoke with nursing who reports patient is used to taking her Vicodin with good success every two hours at home.  The patient was able to have her doses this am and is doing much better..  I will return to interview patient and help in anyway that I can.  Nolton Denis L. Ladona Ridgel, MD MBA The Palliative Medicine Team at [facility] Phone: [phone] Pager: [phone]

## 2012-09-05 ENCOUNTER — Encounter (HOSPITAL_COMMUNITY): Payer: Self-pay | Admitting: Orthopedic Surgery

## 2012-09-05 LAB — BASIC METABOLIC PANEL
BUN: 6 mg/dL (ref 6–23)
Chloride: 103 mEq/L (ref 96–112)
GFR calc Af Amer: >90 mL/min (ref >90)
Potassium: 3.8 mEq/L (ref 3.5–5.1)
Sodium: 139 mEq/L (ref 135–145)

## 2012-09-05 LAB — CBC
HCT: 25.2 % — ABNORMAL LOW (ref 36.0–46.0)
HCT: 25.7 % — ABNORMAL LOW (ref 36.0–46.0)
Hemoglobin: 8.5 g/dL — ABNORMAL LOW (ref 12.0–15.0)
MCH: 31.3 pg (ref 26.0–34.0)
MCHC: 33.7 g/dL (ref 30.0–36.0)
RDW: 13.9 % (ref 11.5–15.5)
WBC: 9 10*3/uL (ref 4.0–10.5)

## 2012-09-05 MED ORDER — METHOCARBAMOL 500 MG PO TABS
500.0000 mg | ORAL_TABLET | Freq: Four times a day (QID) | ORAL | Status: DC | PRN
  Administered 2012-09-05 – 2012-09-08 (×8): 500 mg via ORAL
  Filled 2012-09-05 (×8): qty 1

## 2012-09-05 MED ORDER — KCL IN DEXTROSE-NACL 20-5-0.45 MEQ/L-%-% IV SOLN
INTRAVENOUS | Status: DC
  Administered 2012-09-05: 18:00:00 via INTRAVENOUS
  Filled 2012-09-05 (×2): qty 1000

## 2012-09-05 NOTE — Progress Notes (Signed)
Physical Therapy Treatment Patient Details Name: [name] MRN: [id number] DOB: 08-Jun-1956 Today's Date: 09/05/2012 Time: 8295-6213 PT Time Calculation (min): 27 min  PT Assessment / Plan / Recommendation Comments on Treatment Session  Pt s/p T8-S1 fusion + bone graft harvest and removal of spinal cord stim electrode (09/02/12), with previous anterior lumbar fusion L4-5, L5-S1 (06/28/12).  Pt presents today with improved activity tolerance, mobility, and strength.  Pt is motivated and happy to work with PT.  Will continue to benefit from skilled therapy to maximize her progress in the acute care setting.     Follow Up Recommendations  CIR           Equipment Recommendations  None recommended by PT;None recommended by OT       Frequency Min 5X/week   Plan Discharge plan remains appropriate;Frequency remains appropriate    Precautions / Restrictions Precautions Precautions: Fall;Back Precaution Booklet Issued: Yes (comment) Restrictions Weight Bearing Restrictions: No   Pertinent Vitals/Pain HR: 95-114 with activity O2 sats: 90-100% with walking on RA Pain in back; 5/10 with walking.    Mobility  Bed Mobility Bed Mobility: Rolling Right Rolling Right: 4: Min assist Rolling Left: 4: Min assist Left Sidelying to Sit: 3: Mod assist Sitting - Scoot to Edge of Bed: 4: Min guard Details for Bed Mobility Assistance: Used UE to assist; moved hips/shoulders in one unit well.  Transfers Transfers: Sit to Stand;Stand to Sit Sit to Stand: 4: Min assist;With upper extremity assist;From bed Stand to Sit: 4: Min assist;With upper extremity assist;To bed Details for Transfer Assistance: Pt stands/sits with ease using left hand on bed, right arm held onto SPT arm.    Ambulation/Gait Ambulation/Gait Assistance: 4: Min assist Ambulation Distance (Feet): 300 Feet (300 with PT, OT took over and continued to walk) Assistive device: Rolling walker Ambulation/Gait Assistance Details: Pt  used RW well and with good posture.  Carried conversation and able to turn head horizontally without lob.  Pt moves slowly and cautious but did not have any issues with R LE buckling today.  Denies dizziness, and states pain remained 5/10 throughout walk.  Gait Pattern: Step-through pattern;Decreased stride length (heel strike) Gait velocity: decreased Stairs: No Wheelchair Mobility Wheelchair Mobility: No      PT Goals Acute Rehab PT Goals PT Goal: Supine/Side to Sit - Progress: Progressing toward goal PT Goal: Sit to Supine/Side - Progress: Progressing toward goal PT Goal: Sit to Stand - Progress: Progressing toward goal PT Goal: Stand to Sit - Progress: Progressing toward goal Pt will Ambulate: >150 feet;with modified independence;with least restrictive assistive device PT Goal: Ambulate - Progress: Updated due to goal met Pt will Go Up / Down Stairs: 3-5 stairs PT Goal: Up/Down Stairs - Progress: Goal set today PT Goal: Perform Home Exercise Program - Progress: Progressing toward goal  Visit Information  Last PT Received On: 09/05/12 Assistance Needed: +1    Subjective Data  Subjective: "I need to roll, then I will walk."   Cognition  Overall Cognitive Status: Appears within functional limits for tasks assessed/performed Arousal/Alertness: Awake/alert Orientation Level: Oriented X4 / Intact Behavior During Session: [facility] for tasks performed    Balance   Sitting static balance  Balance support: UE supported, feet supported  Comments: 3-5 min without lob.     End of Session PT - End of Session Activity Tolerance: Patient tolerated treatment well Patient left: Other (comment) (pt began OT session after walking with PT) Nurse Communication: Mobility status  Sharion Balloon 09/05/2012, 12:26 PM Sharion Balloon, SPT Acute Rehab Services [phone]

## 2012-09-05 NOTE — Progress Notes (Signed)
Name: [name] MRN: [id number] DOB: November 15, 1955    LOS: 3  PULMONARY / CRITICAL CARE MEDICINE  HPI:   56 years old female with no significant PMH except for chronic back pain of about two years secondary to lumbar scoliosis and spondylosis. She has history of a previous back surgery for an L5-S1 disc herniation with temporary resolution of her pain. Now she has recurrent back pain that is disabling. 11/29  admitted electively for a T8-S1 fusion + bone graft harvest and removal of spinal cord stim electrode. She underwent her surgery without apparent immediate complications. EBL per note was >2500cc with cell saver and received 4 units of FFP. Critical care consult called by Dr. Alveda Reasons for medical management.    EVENT 09/03/12 - in bed supine due to pain. REceived fent in 4h. Pain not relived is between 5-9 at all times. Fentanyl causing itching. She prefers PCA when offered choice 09/04/12: scheduled norco + morphine, palliativa care managing pain  SUBJECTIVE/OVERNIGHT/INTERVAL HX Pain much improved  Vital Signs: Temp:  [98.2 F (36.8 C)-99.8 F (37.7 C)] 98.6 F (37 C) (12/02 0806) Pulse Rate:  [90-120] 96  (12/02 0800) Resp:  [15-24] 16  (12/02 0800) BP: (79-113)/(31-77) 91/48 mmHg (12/02 0700) SpO2:  [90 %-99 %] 99 % (12/02 0800)  Physical Examination: General:  Obese, Lying recumbent in bed  Neuro:  Awake, alert,. Pain improved HEENT:  PERRL, pink conjunctivae, moist membranes Neck:  Supple, no JVD   Cardiovascular:  RRR, no M/R/G Lungs:  CTA Abdomen:  Soft, nontender, nondistended, bowel sounds present Musculoskeletal:  . Surgical wound dressed.drains with blood Skin:  No rash  Laboratory data:    Lab 09/05/12 0445 09/04/12 0455 09/03/12 0540  HGB 8.5* 8.5* 10.8*  HCT 25.7* 25.2* 32.2*  WBC 9.0 9.2 7.8  PLT 99* 80* 95*    Lab 09/05/12 0445 09/04/12 0455 09/03/12 0540 09/02/12 1901 09/02/12 1540  NA 139 138 137 138 139  K 3.8 3.6 -- -- --  CL 103 102  101 103 --  CO2 29 29 29 27  --  GLUCOSE 131* 127* 126* 129* 97  BUN 6 4* 7 9 --  CREATININE 0.55 0.60 0.68 0.63 --  CALCIUM 8.3* 8.1* 8.0* 7.9* --  MG -- 1.8 -- -- --  PHOS -- 2.3 -- -- --    No results found for this basename: TROPONINI:5 in the last 168 hours No results found for this basename: PROCALCITON:5 in the last 168 hours No results found for this basename: PHART:5,PCO2:5,PCO2ART:5,PO2:5,PO2ART:5,HCO3:5,TCO2:5,O2SAT:5 in the last 168 hours No results found for this basename: PROBNP:5 in the last 168 hours  Ct Thoracic Spine Wo Contrast  09/04/2012  *RADIOLOGY REPORT*  Clinical Data:  Thoracic and lumbar fusion.  Check hardware placement  CT THORACIC AND LUMBAR SPINE WITHOUT CONTRAST  Technique:  Multidetector CT imaging of the thoracic and lumbar spine was performed without contrast. Multiplanar CT image reconstructions were also generated.  Comparison:  09/02/2012  CT THORACIC SPINE  Findings:  Thoracic dextroscoliosis measuring 14 degrees at T5-6. Negative for fracture or mass lesion.  There is mild thoracic disc degeneration from T8-T12 with mild disc space narrowing and small Schmorl's nodes.  No significant spondylosis.  Posterior rod fusion from T7-S1.  Laminar hooks are present on T7, T8, T9, and T11 in satisfactory position.  Posterior rods are present bilaterally.  Posterior lateral bone graft material is present in the soft tissues.  Bone stimulator is in place with a wire located along  the bone graft material on the right in the thoracic spine.  There is mild bibasilar atelectasis the lungs.  No hematoma or fluid collection is present.  IMPRESSION: Thoracic scoliosis.  Thoracic and lumbar fusion from T7-S1. Hardware in the thoracic spine in satisfactory position.  CT LUMBAR SPINE  Findings: Lumbar levoscoliosis measures 19 degrees at L2-3. Negative for fracture or mass lesion.  Mild posterior slip L2-3 measuring approximately 3 mm.  Posterior rod fusion from T7-S1.  Laminar hooks  at T11 in satisfactory position.  Bilateral pedicle screws are in satisfactory position at L1, L2, L3, L4, L5, and S1.  There also are screws extending into the iliac bone bilaterally.  Posterior bone graft material is present around the facet joints and lamina throughout the lumbar spine.  Interbody fusion with metal spacer and anterior screws at L4-5 and L5-S1 in satisfactory position.  Evaluation of the spinal canal is limited due to extensive streak artifact from hardware.  IMPRESSION: Focal lumbar fusion as above.  Hardware is in satisfactory position.  No acute fracture or immediate complication.   Original Report Authenticated By: Janeece Riggers, M.D.    Ct Lumbar Spine Wo Contrast  09/04/2012  *RADIOLOGY REPORT*  Clinical Data:  Thoracic and lumbar fusion.  Check hardware placement  CT THORACIC AND LUMBAR SPINE WITHOUT CONTRAST  Technique:  Multidetector CT imaging of the thoracic and lumbar spine was performed without contrast. Multiplanar CT image reconstructions were also generated.  Comparison:  09/02/2012  CT THORACIC SPINE  Findings:  Thoracic dextroscoliosis measuring 14 degrees at T5-6. Negative for fracture or mass lesion.  There is mild thoracic disc degeneration from T8-T12 with mild disc space narrowing and small Schmorl's nodes.  No significant spondylosis.  Posterior rod fusion from T7-S1.  Laminar hooks are present on T7, T8, T9, and T11 in satisfactory position.  Posterior rods are present bilaterally.  Posterior lateral bone graft material is present in the soft tissues.  Bone stimulator is in place with a wire located along the bone graft material on the right in the thoracic spine.  There is mild bibasilar atelectasis the lungs.  No hematoma or fluid collection is present.  IMPRESSION: Thoracic scoliosis.  Thoracic and lumbar fusion from T7-S1. Hardware in the thoracic spine in satisfactory position.  CT LUMBAR SPINE  Findings: Lumbar levoscoliosis measures 19 degrees at L2-3. Negative for  fracture or mass lesion.  Mild posterior slip L2-3 measuring approximately 3 mm.  Posterior rod fusion from T7-S1.  Laminar hooks at T11 in satisfactory position.  Bilateral pedicle screws are in satisfactory position at L1, L2, L3, L4, L5, and S1.  There also are screws extending into the iliac bone bilaterally.  Posterior bone graft material is present around the facet joints and lamina throughout the lumbar spine.  Interbody fusion with metal spacer and anterior screws at L4-5 and L5-S1 in satisfactory position.  Evaluation of the spinal canal is limited due to extensive streak artifact from hardware.  IMPRESSION: Focal lumbar fusion as above.  Hardware is in satisfactory position.  No acute fracture or immediate complication.   Original Report Authenticated By: Janeece Riggers, M.D.      Patient Active Problem List  Diagnosis  . Postoperative pain     ASSESSMENT: 56 years old female with no significant PMH except for chronic back pain of about two years secondary to lumbar scoliosis and spondylosis. Presents electively for  T8-S1 fusion + bone graft harvest and removal of spinal cord stim electrode. EBL of >2500,  cell saver was used. Received 4 units of FFP.    - 09/03/12: Main issue is severe acute postoperative pain   RECOMMENDATIONS: -Pain mx per palliative care- morphine PCA + norco - Supplemental oxygen via [place] to keep O2 sat >92% - can dc IVfs - Incentive spirometry - DVT prophylaxis with SCD's and early ambulation. - Wound care per surgical team. -  diet as tolerated. - Continue home medications  - dc IV protonix. -OK to  transfer to floor  PCCM  To sign off   Aldrick Derrig V.  [phone] 09/05/2012 9:31 AM

## 2012-09-05 NOTE — Progress Notes (Signed)
Seen this AM in room 2311.  She is doing well, passing flatus, tolerating po fluids with apparently good pain control  Assessment: ready for transfer to 5N

## 2012-09-05 NOTE — Progress Notes (Signed)
Patient TK:ZSWFUX D Ginley      DOB: Jan 05, 1956      NAT:[id number]   Palliative Medicine Team at [facility] Progress Note    Subjective: patien looks well states pain control excellent.  Has only needed a few hits on the PCA to settle her symptoms down.  Diet advanced today.  Will leave PCA overnight and decided on when to discontinue tomorrow.  Total 24 hour opiate use 10.96 mg of Morphine and 70 mg oxycodone. Patient rates pain 3-4  Filed Vitals:   09/05/12 1200  BP:   Pulse:   Temp:   Resp: 18   Physical exam:  General: No acute distress,  Was sleeping soundly on my first stop returned to find pt awake alert and talking with her sister.  PERRL, EOMI, MMmoist Chest: decreased but clear CVS: regular, rate and rhythm, S1, S2 Abd: soft , NT, ND now with positive bowel sounds Ext: much improved strength and movement particularly of the right leg Neuro: awake , alert and oriented,  Improved weakness in the lower extremities   Lab Results  Component Value Date   WBC 9.0 09/05/2012   HGB 8.5* 09/05/2012   HCT 25.7* 09/05/2012   MCV 92.1 09/05/2012   PLT 99* 09/05/2012   Lab Results  Component Value Date   CREATININE 0.55 09/05/2012   BUN 6 09/05/2012   NA 139 09/05/2012   K 3.8 09/05/2012   CL 103 09/05/2012   CO2 29 09/05/2012    Assessment and plan: 56 year old white female s/p extensive back surgery.  Post operative pain was an issue but is currently controlled on reduced dose pca morphine and her usual Norco dose. Would continue PCA for now.  1.  Full code  2.  Back Pain- post op: continue Norco q 2 hrs prn with half dose pca for now.  Will reevaluate in the am.  Nahshon Reich L. Ladona Ridgel, MD MBA The Palliative Medicine Team at [facility] Phone: [phone] Pager: [phone]

## 2012-09-05 NOTE — Progress Notes (Signed)
Orthopedic Tech Progress Note Patient Details:  [name] 1955-12-26 [id number]  Patient ID: [name], female   DOB: [birth year], 56 y.o.   MRN: [id number]   [name] 09/05/2012, 4:40 PM TLSO WITH LINERS COMPLETED BY [employer].

## 2012-09-05 NOTE — Progress Notes (Signed)
Orthopedic Tech Progress Note Patient Details:  [name] [birth year] [id number]  Patient ID: [name], female   DOB: [birth year], 56 y.o.   MRN: [id number]   Shawnie Pons 09/05/2012, 10:56 AM CALLED [employer] FOR TLSO.

## 2012-09-05 NOTE — Progress Notes (Signed)
Rehab Admissions Coordinator Note:  Patient was screened by Brock Ra for appropriateness for an Inpatient Acute Rehab Consult.  At this time, we are recommending Inpatient Rehab consult.  Pt has private insurance. Once consult is completed, &, if CIR is recommended, info will be faxed to pt's insurance for authorization.  Will f/up. Brock Ra 09/05/2012, 9:38 AM  I can be reached at [phone].

## 2012-09-05 NOTE — Progress Notes (Signed)
[name],PT Acute Rehabilitation [phone] (pager)  

## 2012-09-05 NOTE — Progress Notes (Signed)
CARE MANAGEMENT NOTE 09/05/2012  Patient:  [name], [name]   Account Number:  [id number]  Date Initiated:  09/05/2012  Documentation initiated by:  Vance Peper  Subjective/Objective Assessment:   56 yr old female s/p T8-S1 fusion and bone graft harvest, removal of spinal cord stimulator     Action/Plan:   No home health needs identified ny PT or OT. Patient has all necessary DME.   Anticipated DC Date:     Anticipated DC Plan:  HOME/SELF CARE         Choice offered to / List presented to:             Status of service:  In process, will continue to follow Medicare Important Message given?   (If response is "NO", the following Medicare IM given date fields will be blank) Date Medicare IM given:   Date Additional Medicare IM given:    Discharge Disposition:    Per UR Regulation:    If discussed at Long Length of Stay Meetings, dates discussed:    Comments:

## 2012-09-05 NOTE — Evaluation (Signed)
Occupational Therapy Evaluation Patient Details Name: LIBA SIBERT MRN: [id number] DOB: Jun 06, 1956 Today's Date: 09/05/2012 Time: 4098-1191 OT Time Calculation (min): 26 min  OT Assessment / Plan / Recommendation Clinical Impression  This 56 y.o. female admitted for T8-S1 fusion, and recent L5-S1 fusion.  Pt. has all needed DME and AE from previous surgery.  Pt. will benefit from OT to maximize safety and independence with BADLs to allow pt. to return home at min A level    OT Assessment  Patient needs continued OT Services    Follow Up Recommendations  No OT follow up;Supervision - Intermittent    Barriers to Discharge None    Equipment Recommendations  None recommended by PT;None recommended by OT    Recommendations for Other Services    Frequency  Min 2X/week    Precautions / Restrictions Precautions Precautions: Fall;Back Precaution Booklet Issued: Yes (comment) Restrictions Weight Bearing Restrictions: No       ADL  Eating/Feeding: Independent Where Assessed - Eating/Feeding: Bed level Grooming: Wash/dry hands;Wash/dry face;Teeth care;Minimal assistance Where Assessed - Grooming: Supported standing Upper Body Bathing: Moderate assistance Where Assessed - Upper Body Bathing: Supported sitting Lower Body Bathing: Moderate assistance Where Assessed - Lower Body Bathing: Supported sit to stand Upper Body Dressing: Moderate assistance Where Assessed - Upper Body Dressing: Unsupported sitting Lower Body Dressing: Maximal assistance Where Assessed - Lower Body Dressing: Supported sit to stand Toilet Transfer: Minimal assistance Toilet Transfer Method: Sit to Barista: Comfort height toilet;Grab bars Toileting - Clothing Manipulation and Hygiene: Moderate assistance Where Assessed - Toileting Clothing Manipulation and Hygiene: Standing Transfers/Ambulation Related to ADLs: Pt ambulates with min A using RW ADL Comments: Pt. able to verbalize safe  techniques for LB ADLs from last admission.      OT Diagnosis: Generalized weakness;Acute pain  OT Problem List: Decreased strength;Decreased activity tolerance;Pain;Decreased knowledge of precautions OT Treatment Interventions: Self-care/ADL training;Therapeutic activities;Patient/family education   OT Goals Acute Rehab OT Goals OT Goal Formulation: With patient Time For Goal Achievement: 09/12/12 Potential to Achieve Goals: Good ADL Goals Pt Will Perform Grooming: with supervision;Standing at sink ADL Goal: Grooming - Progress: Goal set today Pt Will Perform Upper Body Bathing: with set-up;Sitting, chair;Sitting, edge of bed ADL Goal: Upper Body Bathing - Progress: Goal set today Pt Will Perform Lower Body Bathing: with supervision;Sit to stand from chair;Sit to stand from bed ADL Goal: Lower Body Bathing - Progress: Goal set today Pt Will Perform Upper Body Dressing: with set-up;Sitting, chair;Sitting, bed ADL Goal: Upper Body Dressing - Progress: Goal set today Pt Will Perform Lower Body Dressing: with supervision;Sit to stand from chair;Sit to stand from bed;with adaptive equipment ADL Goal: Lower Body Dressing - Progress: Goal set today Pt Will Transfer to Toilet: with supervision;Ambulation;Comfort height toilet ADL Goal: Toilet Transfer - Progress: Goal set today Pt Will Perform Toileting - Clothing Manipulation: with supervision;Standing ADL Goal: Toileting - Clothing Manipulation - Progress: Goal set today Pt Will Perform Toileting - Hygiene: with supervision;Sit to stand from 3-in-1/toilet ADL Goal: Toileting - Hygiene - Progress: Goal set today Pt Will Perform Tub/Shower Transfer: Shower transfer;with supervision;Ambulation;Grab bars ADL Goal: Tub/Shower Transfer - Progress: Goal set today  Visit Information  Last OT Received On: 09/05/12 Assistance Needed: +1    Subjective Data  Subjective: "I learned a lot last time" re: how to perform ADLs Patient Stated Goal: To  reduce pain and go home   Prior Functioning     Home Living Lives With: Spouse Available Help at  Discharge: Family;Available PRN/intermittently Type of Home: House Home Access: Stairs to enter [employer] of Steps: 4 Entrance Stairs-Rails: Left Home Layout: One level Bathroom Shower/Tub: Walk-in shower;Tub only;Tub/shower unit Bathroom Toilet: Handicapped height Bathroom Accessibility: Yes How Accessible: Accessible via walker Home Adaptive Equipment: Hand-held shower hose;Built-in shower seat;Walker - rolling;Straight cane;Bedside commode/3-in-1;Reacher;Long-handled sponge Prior Function Level of Independence: Independent with assistive device(s) Able to Take Stairs?: Yes Driving: Yes Vocation: On disability Communication Communication: No difficulties         Vision/Perception     Cognition  Overall Cognitive Status: Appears within functional limits for tasks assessed/performed Arousal/Alertness: Awake/alert Orientation Level: Oriented X4 / Intact Behavior During Session: [facility] for tasks performed    Extremity/Trunk Assessment Right Upper Extremity Assessment RUE ROM/Strength/Tone: WFL for tasks assessed RUE Coordination: WFL - gross/fine motor Left Upper Extremity Assessment LUE ROM/Strength/Tone: WFL for tasks assessed LUE Coordination: WFL - gross/fine motor     Mobility Bed Mobility Bed Mobility: Rolling Right Rolling Right: 4: Min assist Rolling Left: 4: Min assist Left Sidelying to Sit: 3: Mod assist Sitting - Scoot to Edge of Bed: 4: Min guard Sit to Sidelying Left: 3: Mod assist;HOB flat Details for Bed Mobility Assistance: Requires assist to lift LEs onto bed Transfers Transfers: Stand to Sit Sit to Stand: 4: Min assist;With upper extremity assist;From bed Stand to Sit: 4: Min assist;With upper extremity assist;To bed Details for Transfer Assistance: Pt stands/sits with ease using left hand on bed, right arm held onto SPT arm.         Shoulder Instructions     Exercise     Balance     End of Session OT - End of Session Activity Tolerance: Patient tolerated treatment well Patient left: in bed;with call bell/phone within reach;with nursing in room Nurse Communication: Mobility status;Patient requests pain meds  GO     Pilar Corrales, Ursula Alert M 09/05/2012, 12:24 PM

## 2012-09-06 ENCOUNTER — Encounter (HOSPITAL_COMMUNITY): Payer: Self-pay | Admitting: General Practice

## 2012-09-06 LAB — TYPE AND SCREEN: Unit division: 0

## 2012-09-06 MED FILL — Sodium Chloride Irrigation Soln 0.9%: Qty: 3000 | Status: AC

## 2012-09-06 MED FILL — Sodium Chloride IV Soln 0.9%: INTRAVENOUS | Qty: 1000 | Status: AC

## 2012-09-06 MED FILL — Heparin Sodium (Porcine) Inj 1000 Unit/ML: INTRAMUSCULAR | Qty: 30 | Status: AC

## 2012-09-06 NOTE — Progress Notes (Signed)
Occupational Therapy Treatment Patient Details Name: [name] MRN: [id number] DOB: 1956-02-06 Today's Date: 09/06/2012 Time: 1820-1900 OT Time Calculation (min): 40 min  OT Assessment / Plan / Recommendation Comments on Treatment Session Pt making excellent progress. Feel pt will be able to D/C home with [facility] services with appropriate AE. Discussed home set up and home modifications in detail. Feel pt would be able to D/C homeThursday/Friday. will continue with education on AE and compensatory techniques for self care.     Follow Up Recommendations  Home health OT    Barriers to Discharge       Equipment Recommendations  None recommended by OT    Recommendations for Other Services    Frequency Min 3X/week   Plan Discharge plan remains appropriate    Precautions / Restrictions Precautions Precautions: Fall;Back Required Braces or Orthoses: Spinal Brace Spinal Brace: Thoracolumbosacral orthotic;Applied in standing position   Pertinent Vitals/Pain Using PCA. Pain not limiting session    ADL  Toilet Transfer: Supervision/safety Toilet Transfer Method: Sit to stand;Stand pivot Toilet Transfer Equipment: Bedside commode Toileting - Clothing Manipulation and Hygiene: Other (comment) (educated pt on use of tongs and wet wipes for hygieneq) Transfers/Ambulation Related to ADLs: Min guard for ambulation. S sit - stand ADL Comments: Able to verbalize 3/3 precautions. Began education on donning/doffing TLSO. Education regarding AE and toileting.    OT Diagnosis:    OT Problem List:   OT Treatment Interventions:     OT Goals Acute Rehab OT Goals OT Goal Formulation: With patient Time For Goal Achievement: 09/12/12 Potential to Achieve Goals: Good ADL Goals Pt Will Perform Grooming: with supervision;Standing at sink ADL Goal: Grooming - Progress: Met Pt Will Perform Upper Body Bathing: with set-up;Sitting, chair;Sitting, edge of bed ADL Goal: Upper Body Bathing - Progress:  Progressing toward goals Pt Will Perform Lower Body Bathing: with supervision;Sit to stand from chair;Sit to stand from bed ADL Goal: Lower Body Bathing - Progress: Progressing toward goals Pt Will Perform Upper Body Dressing: with set-up;Sitting, chair;Sitting, bed ADL Goal: Upper Body Dressing - Progress: Progressing toward goals Pt Will Perform Lower Body Dressing: with supervision;Sit to stand from chair;Sit to stand from bed;with adaptive equipment ADL Goal: Lower Body Dressing - Progress: Progressing toward goals Pt Will Transfer to Toilet: with supervision;Ambulation;Comfort height toilet ADL Goal: Toilet Transfer - Progress: Progressing toward goals Pt Will Perform Toileting - Clothing Manipulation: with supervision;Standing ADL Goal: Toileting - Clothing Manipulation - Progress: Progressing toward goals Pt Will Perform Toileting - Hygiene: with supervision;Sit to stand from 3-in-1/toilet ADL Goal: Toileting - Hygiene - Progress: Progressing toward goals Pt Will Perform Tub/Shower Transfer: Shower transfer;with supervision;Ambulation;Grab bars  Visit Information  Last OT Received On: 09/06/12 Assistance Needed: +1    Subjective Data      Prior Functioning       Cognition  Overall Cognitive Status: Appears within functional limits for tasks assessed/performed Arousal/Alertness: Awake/alert Orientation Level: Oriented X4 / Intact Behavior During Session: [facility] for tasks performed    Mobility  Shoulder Instructions Transfers Transfers: Sit to Stand;Stand to Sit Sit to Stand: 5: Supervision;With upper extremity assist;From chair/3-in-1 Stand to Sit: 5: Supervision;With upper extremity assist;To chair/3-in-1       Exercises      Balance  S   End of Session OT - End of Session Equipment Utilized During Treatment: Gait belt;Back brace Activity Tolerance: Patient tolerated treatment well Patient left: in chair;with call bell/phone within reach Nurse Communication:  Mobility status  GO  Armani Gawlik,HILLARY 09/06/2012, 7:06 PM Luisa Dago, OTR/L  [phone] 09/06/2012

## 2012-09-06 NOTE — Progress Notes (Signed)
UR COMPLETED  

## 2012-09-06 NOTE — Progress Notes (Signed)
CSW received a late  referral that patient may need SNF placement based on PT evaluation. CSW will complete a comprehensive psychosocial with patient/family in the am.   Sabino Niemann, MSW, [employer] [phone]

## 2012-09-06 NOTE — Progress Notes (Signed)
Seen and examined in room # 5N20  S: concerned about how she will cope at home  O: Sitting in chair, able with some assistance to get up and stand with walker, good strength both LE's  A: satisfactory  P: will ask discharge nurse to see

## 2012-09-06 NOTE — Progress Notes (Signed)
Patient complaining of new pain in right lower back. Describes this pain as a stabbing and digging pain. Back assessed, no changes from am assessment of appearance of back.

## 2012-09-06 NOTE — Progress Notes (Signed)
Physical Therapy Treatment Patient Details Name: [name] MRN: [id number] DOB: 12/03/1955 Today's Date: 09/06/2012 Time: 4098-1191 PT Time Calculation (min): 43 min  PT Assessment / Plan / Recommendation Comments on Treatment Session  Pt now with TLSO in room and requires maxA to don. patient reports spouse to work and she will be home alone during day. patient may need ST-SNF placement due to back precautions, inability to don/doff TLSO i'ly and pain managment. Patient tolerating mobility well however unable to transfer in/out of bed with HOB flat like it would be at home.    Follow Up Recommendations  SNF;Supervision/Assistance - 24 hour     Does the patient have the potential to tolerate intense rehabilitation     Barriers to Discharge        Equipment Recommendations  Rolling walker with 5" wheels    Recommendations for Other Services    Frequency Min 5X/week   Plan Discharge plan remains appropriate;Frequency remains appropriate    Precautions / Restrictions Precautions Precautions: Fall;Back Required Braces or Orthoses: Spinal Brace Spinal Brace: Thoracolumbosacral orthotic;Applied in standing position (no orders for TLSO or position to don brace in, attempted to in sidelying patient refused and completed it in standing. Patient has walked and completed OOB mobility since admission without TLSO.  Restrictions Weight Bearing Restrictions: No   Pertinent Vitals/Pain 6/10 surgical back pain, RN provided pain meds    Mobility  Bed Mobility Bed Mobility: Right Sidelying to Sit Right Sidelying to Sit: 4: Min assist;With rails;HOB elevated Sitting - Scoot to Edge of Bed: 5: Supervision Transfers Transfers: Sit to Stand;Stand to Sit Sit to Stand: 4: Min guard;Without upper extremity assist;From bed Stand to Sit: 4: Min guard;With upper extremity assist;To chair/3-in-1 Ambulation/Gait Ambulation/Gait Assistance: 4: Min guard Ambulation Distance (Feet): 300  Feet Assistive device: Rolling walker Ambulation/Gait Assistance Details: slow, TLSO made pt maintain erect posture, no LOB Gait Pattern: Step-through pattern;Decreased stride length Gait velocity: decreased    Exercises     PT Diagnosis:    PT Problem List:   PT Treatment Interventions:     PT Goals Acute Rehab PT Goals PT Goal: Supine/Side to Sit - Progress: Progressing toward goal PT Goal: Sit to Supine/Side - Progress: Progressing toward goal PT Goal: Sit to Stand - Progress: Progressing toward goal PT Goal: Stand to Sit - Progress: Progressing toward goal PT Goal: Ambulate - Progress: Progressing toward goal  Visit Information  Last PT Received On: 09/06/12 Assistance Needed: +1    Subjective Data  Subjective: Pt received in R sidelying. TLSO now present in room, however no orders. Patient unclear on which position to don it in and no orders clarifying.   Cognition  Overall Cognitive Status: Appears within functional limits for tasks assessed/performed Arousal/Alertness: Awake/alert Orientation Level: Oriented X4 / Intact Behavior During Session: WFL for tasks performed    Balance     End of Session PT - End of Session Equipment Utilized During Treatment: Gait belt;Back brace Activity Tolerance: Patient tolerated treatment well Patient left: in chair;with call bell/phone within reach;with family/visitor present Nurse Communication: Mobility status   GP     Marcene Brawn 09/06/2012, 2:03 PM Lewis Shock, PT, DPT Pager #: [phone] Office #: [phone]

## 2012-09-06 NOTE — Progress Notes (Signed)
Patient [name]      DOB: April 13, 1956      VWU:[id number]   Palliative Medicine Team at [facility] Progress Note    Subjective: Nursing reported patient was tearful this morning.  She has affirmed her medications with each nurse to assure that she is getting her Norco q 2 hours even during the night.  She states she feels better than this morning when she was overwhelmed with not being able to care for herself. She reports she is open to rehab stay until she can get her ability to get out of bed on her own and clean herself when she uses the bathroom.  Total morphine use in the last 12 hours was 36.89 mg in addition to her oxycodone 10/325 x 4 doses for the same time period.  I attempted to suggest possibly scheduling her oxycodone every 4 hours with q2 hour breakthrough and considering using long acting opiate to replace the 1/2 dose PCA.  She stated she was not quite ready for that change yet and did not like using anything but her short acting Norco.  Will reval in AM is still using high doses of morphine with hydrocodone, might need to add MS contin with hydrocodone so that she can free herself from the PCA and pursue rehab. Filed Vitals:   09/06/12 1320  BP: 110/62  Pulse: 85  Temp: 98 F (36.7 C)  Resp: 18   Physical exam:  General: no acute distress , goofy after having robaxin. PERRL, EOMI, anciteric mmm Chest : decreased but clear CVS: regular , rate and rhythm, S1, and S2 Abd: obese , soft, positive bowel sounds Ext: warm, no edema Neuro: awake, alert,  Trouble with memory retrevial after robaxin   Lab Results  Component Value Date   CREATININE 0.55 09/05/2012   BUN 6 09/05/2012   NA 139 09/05/2012   K 3.8 09/05/2012   CL 103 09/05/2012   CO2 29 09/05/2012   Lab Results  Component Value Date   WBC 9.0 09/05/2012   HGB 8.5* 09/05/2012   HCT 25.7* 09/05/2012   MCV 92.1 09/05/2012   PLT 99* 09/05/2012   Assessment and plan: 56 yr old s/p major spinal surgery having  post op pain that is slightly worse but different than her preop pain.  If patient takes Norco q 2 she has most of her pain relieved but reports that she needs to use the PCA intermittently to keep pain from getting out of control. She is not open to scheduled dose to try to keep pain controlled and knows that she can't fully take care of herself yet at home. She is open to rehab.  We will need to switch her to a long acting agent if she is unable to use just her Norco.  She was not open to this today, but I would like to recommend adding MS Contin 15 mg q 12 with prn hydrocodone for breakthrough.  Will ask my partner to work with her in the am toward this goal. Could also decrease pca bolus to to 0.5 then to off.  1.  Full code  2.  Back Pain:  Permit one more night with pca then consider decreasing bolus rate to 0.5mg  vs dc and start ms contin.  Will ask my partner to reassess in the am.   3.  Bowel regime:  On adequate agents.  Last BM 11/28.  Consider Miralax in am if no movement.   Total time with patient  15 min   Jilliane Kazanjian L. Ladona Ridgel, MD MBA The Palliative Medicine Team at [facility] Phone: [phone] Pager: [phone]

## 2012-09-07 DIAGNOSIS — M539 Dorsopathy, unspecified: Secondary | ICD-10-CM | POA: Diagnosis present

## 2012-09-07 DIAGNOSIS — M419 Scoliosis, unspecified: Secondary | ICD-10-CM | POA: Diagnosis present

## 2012-09-07 DIAGNOSIS — G8918 Other acute postprocedural pain: Secondary | ICD-10-CM

## 2012-09-07 DIAGNOSIS — F329 Major depressive disorder, single episode, unspecified: Secondary | ICD-10-CM | POA: Diagnosis present

## 2012-09-07 DIAGNOSIS — N951 Menopausal and female climacteric states: Secondary | ICD-10-CM | POA: Diagnosis present

## 2012-09-07 MED ORDER — PANTOPRAZOLE SODIUM 40 MG PO TBEC
40.0000 mg | DELAYED_RELEASE_TABLET | Freq: Every day | ORAL | Status: DC
  Administered 2012-09-07 – 2012-09-08 (×2): 40 mg via ORAL
  Filled 2012-09-07 (×3): qty 1

## 2012-09-07 MED ORDER — TEMAZEPAM 7.5 MG PO CAPS
7.5000 mg | ORAL_CAPSULE | Freq: Every evening | ORAL | Status: DC | PRN
  Administered 2012-09-07: 7.5 mg via ORAL
  Filled 2012-09-07: qty 1

## 2012-09-07 MED ORDER — MELOXICAM 15 MG PO TABS
15.0000 mg | ORAL_TABLET | Freq: Every day | ORAL | Status: DC
  Administered 2012-09-07 – 2012-09-08 (×2): 15 mg via ORAL
  Filled 2012-09-07 (×4): qty 1

## 2012-09-07 MED ORDER — MORPHINE SULFATE ER 15 MG PO TBCR
15.0000 mg | EXTENDED_RELEASE_TABLET | Freq: Three times a day (TID) | ORAL | Status: DC
  Administered 2012-09-07 – 2012-09-08 (×3): 15 mg via ORAL
  Filled 2012-09-07 (×3): qty 1

## 2012-09-07 MED ORDER — POLYETHYLENE GLYCOL 3350 17 G PO PACK
17.0000 g | PACK | Freq: Every day | ORAL | Status: DC
  Administered 2012-09-07 – 2012-09-08 (×2): 17 g via ORAL
  Filled 2012-09-07 (×4): qty 1

## 2012-09-07 MED ORDER — FLEET ENEMA 7-19 GM/118ML RE ENEM
1.0000 | ENEMA | Freq: Every day | RECTAL | Status: DC | PRN
  Administered 2012-09-08: 1 via RECTAL
  Filled 2012-09-07: qty 1

## 2012-09-07 MED ORDER — WHITE PETROLATUM GEL
Status: AC
  Filled 2012-09-07: qty 5

## 2012-09-07 MED ORDER — SENNA 8.6 MG PO TABS
2.0000 | ORAL_TABLET | Freq: Two times a day (BID) | ORAL | Status: DC
  Administered 2012-09-07 – 2012-09-09 (×3): 17.2 mg via ORAL
  Filled 2012-09-07 (×7): qty 2

## 2012-09-07 NOTE — Progress Notes (Signed)
Seen in room 5N20 this evening  S: Bad day, ++pain radiating anteriorly and bilaterally,thinks the pain is secondary to TLSO. not sure as to whether she should go to a SNF first or try to cope at home.  O: dressing dry, sitting on side of bed  A: Her pain is not too unusual postop this magnitude of spinal surgery.  P: May be up and ambulate without TLSO for now., Ask for PT input as to whether she should be transferred to a SNF vs. Home from here

## 2012-09-07 NOTE — Progress Notes (Signed)
PT Cancellation Note  Patient Details Name: XITLALY AULT MRN: [id number] DOB: [birth year]   Cancelled Treatment:    Reason Eval/Treat Not Completed: Other (comment) (Patient cancelled due to pain x3)   Deryl Ports, Adline Potter 09/07/2012, 2:14 PM

## 2012-09-07 NOTE — Progress Notes (Signed)
OT is recommending home with home health and patient reported that she feels equipped to go home. CSW will inform Cm of need for [facility].  Clinical Social Worker will sign off for now as social work intervention is no longer needed. Please consult [place] again if new need arises.   Sabino Niemann, MSW, [phone]

## 2012-09-07 NOTE — Progress Notes (Signed)
Palliative Care Team at [facility] Progress Note   SUBJECTIVE:  Severe pain this AM with muscle spasms, deep ache worse than usual. She has chronic pain prior to admission and has been on opiates for >5 years. She is awake, alert and talkative but states that the opiates in general make her feel tired and less mentally sharp- she expresses concern over increased dose , but also at her worsening pain. No BM since before admission.  Interval Events:  56 yo with severe degenerative disc disease, multilevel with scoliosis who has undergone multiple spine surgeries, last surgery was this admission on 11/29. Prolonged hospital course due to postoperative pain. PMT consulted for pain management.  Started on morphine PCA reduced dose 12/1  in addition to q2 hydrocodone prn which she is getting RTC.  OBJECTIVE: Vital Signs: BP 103/60  Pulse 83  Temp 98.2 F (36.8 C) (Oral)  Resp 15  SpO2 100%   Intake and Output: 12/03 0701 - 12/04 0700 In: 985 [P.O.:600; I.V.:383; IV Piggyback:2] Out: -   Physical Exam: General: Vital signs reviewed and noted. Well-developed, well-nourished, in no acute distress; alert, appropriate and cooperative throughout examination.  Head: Normocephalic, atraumatic.  Lungs:  Normal respiratory effort. Clear to auscultation BL without crackles or wheezes.  Heart: RRR. S1 and S2 normal without gallop,  or rubs. (+) murmur  Abdomen:  BS normoactive. Soft, Nondistended, non-tender.  No masses or organomegaly.  Extremities: No pretibial edema.    Allergies  Allergen Reactions  . Sulfa Antibiotics Itching and Nausea Only  . Dilaudid (Hydromorphone) Other (See Comments)    "didn't work; no relief from that at all " (09/06/2012)    Medications: Scheduled Meds:     . buPROPion  150 mg Oral BID  . docusate sodium  100 mg Oral BID  . estradiol  2 mg Oral Daily  . gabapentin  600 mg Oral TID  . morphine   Intravenous Q4H  . senna  1 tablet Oral BID  . sodium  chloride  3 mL Intravenous Q12H  . vitamin C  500 mg Oral BID  . white petrolatum        Continuous Infusions:    . sodium chloride    . dextrose 5 % and 0.45 % NaCl with KCl 20 mEq/L 20 mL/hr at 09/05/12 1737    PRN Meds: acetaminophen, acetaminophen, bisacodyl, diphenhydrAMINE, diphenhydrAMINE, HYDROcodone-acetaminophen, loratadine, menthol-cetylpyridinium, methocarbamol, naloxone, ondansetron (ZOFRAN) IV, phenol, sodium chloride  Stool Softner: yes   Pain Present?: yes  If yes: Pain Score: 710 Pain Location: Back   Labs: CBC    Component Value Date/Time   WBC 9.0 09/05/2012 0445   RBC 2.79* 09/05/2012 0445   HGB 8.5* 09/05/2012 0445   HCT 25.7* 09/05/2012 0445   PLT 99* 09/05/2012 0445   MCV 92.1 09/05/2012 0445   MCH 30.5 09/05/2012 0445   MCHC 33.1 09/05/2012 0445   RDW 13.9 09/05/2012 0445   LYMPHSABS 2.3 07/03/2011 1156   MONOABS 0.4 07/03/2011 1156   EOSABS 0.2 07/03/2011 1156   BASOSABS 0.0 07/03/2011 1156    CMET     Component Value Date/Time   NA 139 09/05/2012 0445   K 3.8 09/05/2012 0445   CL 103 09/05/2012 0445   CO2 29 09/05/2012 0445   GLUCOSE 131* 09/05/2012 0445   BUN 6 09/05/2012 0445   CREATININE 0.55 09/05/2012 0445   CALCIUM 8.3* 09/05/2012 0445   PROT 7.1 08/26/2012 0921   ALBUMIN 3.5 08/26/2012 0921   AST  19 08/26/2012 0921   ALT 18 08/26/2012 0921   ALKPHOS 82 08/26/2012 0921   BILITOT 0.4 08/26/2012 0921   GFRNONAA >90 09/05/2012 0445   GFRAA >90 09/05/2012 0445     ASSESSMENT/ PLAN:  1. Post-Operative Pain, lumbar- inflammatory, muscle spasm predominate-currently she is getting a total of 90mg  or oral morphine equivalents via her PCA pump in addition she is getting RTC hydrocodone every 2 hours with incomplete relief. She has fatigue and poor sleep and unpredictable pain flares.  I have discussed her pain regimen with her in detail and we have decided on the following:  1. Discontinue PCA Morphine 2. Start MS Contin 15mg  XR q8 hours 3. Ok to  leave hydrocodone prn for breakthrough pain, she has been on this for a long time and has attachment to it as the drug that provided her relief prior to this surgery. 4. Will add on low dose benzodiazapine for sleep and muscle spasm relief once daily in PM-temazapam. 5. Additionally a large component of her pain may be inflammatory, so adding on an NSAID with a PPI may be helpful. Will do a trial of Mobic+omeprazole. No bleed risks or contraindications. 6. Continue Robaxin and Neurontin. 8. SHE HAS NOT HAD BOWEL MOVEMENT SINCE 11/28, may be making her pain worse. Will add on osmotic laxative and enema if no results this evening. 9. Saline lock IV to encourage mobility and also encourage oral fluids.   Time In: 4PM Time Out: 5PM Total Time Spent with Patient: 60 minutes Total Overall Time: 60 minutes   Greater than 50%  of this time was spent counseling and coordinating care related to the above assessment and plan.   Edsel Petrin, DO  09/07/2012, 4:43 PM  Please contact Palliative Medicine Team phone at [phone] for questions and concerns.

## 2012-09-07 NOTE — Progress Notes (Signed)
Occupational Therapy Treatment Patient Details Name: [name] MRN: [id number] DOB: 19-Feb-1956 Today's Date: 09/07/2012 Time: 1308-6578 OT Time Calculation (min): 26 min  OT Assessment / Plan / Recommendation Comments on Treatment Session Pt unable to participate this am due to c/o back spasms. Good performance this pm; however, pt states that she will not wear TLSO as she is convinced that it caused her back spasms. Explained purpose of TLSO and checked fit, which appear OK. Pt stated she would talk with Dr. Alveda Dyer about the brace. Pt funcitoning at level appropriate for D/C home with [facility] services @ Friday. Will follow acutely.    Follow Up Recommendations  Home health OT    Barriers to Discharge   none    Equipment Recommendations  None recommended by OT    Recommendations for Other Services  none  Frequency Min 3X/week   Plan Discharge plan remains appropriate    Precautions / Restrictions Precautions Precautions: Fall;Back Required Braces or Orthoses: Spinal Brace Spinal Brace: Thoracolumbosacral orthotic;Applied in standing position   Pertinent Vitals/Pain 5. No request for meds    ADL  Where Assessed - Grooming: Unsupported standing Upper Body Bathing: Supervision/safety Lower Body Dressing: Minimal assistance;Other (comment) (with use of AE) Toilet Transfer: Supervision/safety Toilet Transfer Method: Sit to stand;Stand pivot Acupuncturist: Materials engineer and Hygiene: Minimal assistance Where Assessed - Engineer, mining and Hygiene: Sit to stand from 3-in-1 or toilet;Standing (using tongs for hygiene) Equipment Used: Back brace;Gait belt;Rolling walker;Reacher;Other (comment) (toilet aid) Transfers/Ambulation Related to ADLs: S RW level ADL Comments: Pt stating that the TLSO is causing her back spasms. Pt wore brace for ambulation, but would not keep it on in sitting. Explained purplse of brace.    OT  Diagnosis:    OT Problem List:   OT Treatment Interventions:     OT Goals Acute Rehab OT Goals OT Goal Formulation: With patient Time For Goal Achievement: 09/12/12 Potential to Achieve Goals: Good ADL Goals Pt Will Perform Grooming: with supervision;Standing at sink ADL Goal: Grooming - Progress: Met Pt Will Perform Upper Body Bathing: with set-up;Sitting, chair;Sitting, edge of bed ADL Goal: Upper Body Bathing - Progress: Met Pt Will Perform Lower Body Bathing: with supervision;Sit to stand from chair;Sit to stand from bed ADL Goal: Lower Body Bathing - Progress: Progressing toward goals Pt Will Perform Upper Body Dressing: with set-up;Sitting, chair;Sitting, bed ADL Goal: Upper Body Dressing - Progress: Met Pt Will Perform Lower Body Dressing: with supervision;Sit to stand from chair;Sit to stand from bed;with adaptive equipment ADL Goal: Lower Body Dressing - Progress: Progressing toward goals Pt Will Transfer to Toilet: with supervision;Ambulation;Comfort height toilet ADL Goal: Toilet Transfer - Progress: Met Pt Will Perform Toileting - Clothing Manipulation: with supervision;Standing ADL Goal: Toileting - Clothing Manipulation - Progress: Met Pt Will Perform Toileting - Hygiene: with supervision;Sit to stand from 3-in-1/toilet ADL Goal: Toileting - Hygiene - Progress: Progressing toward goals Pt Will Perform Tub/Shower Transfer: Shower transfer;with supervision;Ambulation;Grab bars  Visit Information  Last OT Received On: 09/07/12 Assistance Needed: +1    Subjective Data      Prior Functioning       Cognition  Overall Cognitive Status: Appears within functional limits for tasks assessed/performed Arousal/Alertness: Awake/alert Orientation Level: Oriented X4 / Intact Behavior During Session: [facility] for tasks performed    Mobility  Shoulder Instructions Bed Mobility Bed Mobility: Rolling Left;Left Sidelying to Sit;Supine to Sit;Sitting - Scoot to Edge of Bed Rolling  Right: 6: Modified independent (  Device/Increase time) Rolling Left: 6: Modified independent (Device/Increase time) Right Sidelying to Sit: 6: Modified independent (Device/Increase time) Left Sidelying to Sit: 6: Modified independent (Device/Increase time) Supine to Sit: 6: Modified independent (Device/Increase time) Sitting - Scoot to Edge of Bed: 6: Modified independent (Device/Increase time) Transfers Transfers: Sit to Stand;Stand to Sit Sit to Stand: 5: Supervision;With upper extremity assist;From bed Stand to Sit: 6: Modified independent (Device/Increase time);With upper extremity assist;To chair/3-in-1 Details for Transfer Assistance: good use of back precautions       Exercises      Balance  S   End of Session OT - End of Session Equipment Utilized During Treatment: Gait belt;Back brace Activity Tolerance: Patient tolerated treatment well Patient left: in chair;with call bell/phone within reach Nurse Communication: Mobility status;Other (comment) (concerns over back brace)  GO     [name],[name] 09/07/2012, 4:32 PM [facility], OTR/L  [phone] 09/07/2012

## 2012-09-07 NOTE — Clinical Social Work Psychosocial (Addendum)
Clinical Social Worker received referral for the potential need for post acute placement at a SNF. CSW reviewed chart and met with patient at bedside. CSW introduced self, explained role, and provided emotional support. CSW discussed skilled nursing home placement, reviewed the process of placement and answered all the patient's questions. After reviewing the SNF list, Patient reported that she is agreeable to CSW seeking SNF placement near [place], [place]. CSW encouraged patient to ask questions as needed of CSW and medical staff. CSW will begin the placement process and will continue to follow and assist with all d/c planning.  Patient was very appreciative of support and information provided by CSW. CSW will continue to follow and will assist with all d/c planning needs  Sabino Niemann, MSW, [phone]

## 2012-09-08 MED ORDER — MAGNESIUM HYDROXIDE 400 MG/5ML PO SUSP
15.0000 mL | Freq: Every day | ORAL | Status: DC
  Administered 2012-09-08 – 2012-09-09 (×2): 15 mL via ORAL
  Filled 2012-09-08 (×4): qty 30

## 2012-09-08 MED ORDER — KETOROLAC TROMETHAMINE 30 MG/ML IJ SOLN
30.0000 mg | Freq: Once | INTRAMUSCULAR | Status: AC
  Administered 2012-09-08: 30 mg via INTRAVENOUS
  Filled 2012-09-08: qty 1

## 2012-09-08 MED ORDER — GABAPENTIN 400 MG PO CAPS
800.0000 mg | ORAL_CAPSULE | Freq: Three times a day (TID) | ORAL | Status: DC
  Administered 2012-09-08 – 2012-09-09 (×6): 800 mg via ORAL
  Filled 2012-09-08 (×9): qty 2

## 2012-09-08 MED ORDER — GABAPENTIN 800 MG PO TABS
800.0000 mg | ORAL_TABLET | Freq: Three times a day (TID) | ORAL | Status: DC
Start: 2012-09-08 — End: 2012-09-08
  Filled 2012-09-08 (×3): qty 1

## 2012-09-08 MED ORDER — MORPHINE SULFATE ER 15 MG PO TBCR
30.0000 mg | EXTENDED_RELEASE_TABLET | Freq: Three times a day (TID) | ORAL | Status: DC
  Filled 2012-09-08: qty 2

## 2012-09-08 MED ORDER — BACLOFEN 5 MG HALF TABLET
5.0000 mg | ORAL_TABLET | Freq: Four times a day (QID) | ORAL | Status: DC
  Administered 2012-09-08 (×2): 5 mg via ORAL
  Filled 2012-09-08 (×8): qty 1

## 2012-09-08 NOTE — Progress Notes (Signed)
Palliative Care Team at [facility] Progress Note   SUBJECTIVE: Continues to have severe pain that is limiting PT and rehab attempts-mostly c/o spasm. Increased dose of neurontin has helped pain radiating down legs.  Interval Events:  56 yo with severe degenerative disc disease, multilevel with scoliosis who has undergone multiple spine surgeries, last surgery was this admission on 11/29. Prolonged hospital course due to postoperative pain. PMT consulted for pain management.  Started on morphine PCA reduced dose 12/1  in addition to q2 hydrocodone prn which she is getting RTC.  OBJECTIVE: Vital Signs: BP 109/58  Pulse 85  Temp 98.3 F (36.8 C) (Oral)  Resp 16  SpO2 98%   Intake and Output:    Physical Exam: General: Vital signs reviewed and noted. Well-developed, well-nourished, in no acute distress; alert, appropriate and cooperative throughout examination.  Head: Normocephalic, atraumatic.  Lungs:  Normal respiratory effort. Clear to auscultation BL without crackles or wheezes.  Heart: RRR. S1 and S2 normal without gallop,  or rubs. (+) murmur  Abdomen:  BS normoactive. Soft, Nondistended, non-tender.  No masses or organomegaly.  Extremities: No pretibial edema.    Allergies  Allergen Reactions  . Sulfa Antibiotics Itching and Nausea Only  . Dilaudid (Hydromorphone) Other (See Comments)    "didn't work; no relief from that at all " (09/06/2012)    Medications: Scheduled Meds:     . baclofen  5 mg Oral Q6H  . buPROPion  150 mg Oral BID  . docusate sodium  100 mg Oral BID  . estradiol  2 mg Oral Daily  . gabapentin  800 mg Oral TID  . ketorolac  30 mg Intravenous Once  . magnesium hydroxide  15 mL Oral Daily  . meloxicam  15 mg Oral Daily  . morphine  30 mg Oral Q8H  . pantoprazole  40 mg Oral Daily  . polyethylene glycol  17 g Oral Daily  . senna  2 tablet Oral BID  . vitamin C  500 mg Oral BID  . [EXPIRED] white petrolatum      . [DISCONTINUED] gabapentin   600 mg Oral TID  . [DISCONTINUED] gabapentin  800 mg Oral TID  . [DISCONTINUED] morphine  15 mg Oral Q8H  . [DISCONTINUED] morphine   Intravenous Q4H  . [DISCONTINUED] senna  1 tablet Oral BID  . [DISCONTINUED] sodium chloride  3 mL Intravenous Q12H    Continuous Infusions:    . [DISCONTINUED] sodium chloride    . [DISCONTINUED] dextrose 5 % and 0.45 % NaCl with KCl 20 mEq/L 20 mL/hr at 09/05/12 1737    PRN Meds: acetaminophen, acetaminophen, bisacodyl, diphenhydrAMINE, diphenhydrAMINE, HYDROcodone-acetaminophen, loratadine, menthol-cetylpyridinium, naloxone, ondansetron (ZOFRAN) IV, phenol, sodium phosphate, [DISCONTINUED] methocarbamol, [DISCONTINUED] sodium chloride, [DISCONTINUED] temazepam  Stool Softner: yes   Pain Present?: yes  If yes: Pain Score: 710 Pain Location: Back   Labs: CBC    Component Value Date/Time   WBC 9.0 09/05/2012 0445   RBC 2.79* 09/05/2012 0445   HGB 8.5* 09/05/2012 0445   HCT 25.7* 09/05/2012 0445   PLT 99* 09/05/2012 0445   MCV 92.1 09/05/2012 0445   MCH 30.5 09/05/2012 0445   MCHC 33.1 09/05/2012 0445   RDW 13.9 09/05/2012 0445   LYMPHSABS 2.3 07/03/2011 1156   MONOABS 0.4 07/03/2011 1156   EOSABS 0.2 07/03/2011 1156   BASOSABS 0.0 07/03/2011 1156    CMET     Component Value Date/Time   NA 139 09/05/2012 0445   K 3.8 09/05/2012 0445  CL 103 09/05/2012 0445   CO2 29 09/05/2012 0445   GLUCOSE 131* 09/05/2012 0445   BUN 6 09/05/2012 0445   CREATININE 0.55 09/05/2012 0445   CALCIUM 8.3* 09/05/2012 0445   PROT 7.1 08/26/2012 0921   ALBUMIN 3.5 08/26/2012 0921   AST 19 08/26/2012 0921   ALT 18 08/26/2012 0921   ALKPHOS 82 08/26/2012 0921   BILITOT 0.4 08/26/2012 0921   GFRNONAA >90 09/05/2012 0445   GFRAA >90 09/05/2012 0445     ASSESSMENT/ PLAN:  1. Post-Operative Pain, lumbar- inflammatory, muscle spasm predominate Was transitioned to oral regimen yesterday in hopes of being able to facilitate a discharge plan with her pain under moderate  control. However, she continues to have "unbearable" pain characterized by episode of severe spasm which "take her breath away".  I have discussed her again pain regimen with her in detail and we have decided to make the following changes today:  1. Will give 1 time dose of IV Toradol and see if it helps her pain, if so we can start higher dose oral NSAID 2. Increased MS Contin 15mg  XR q8 hours to 30mg  q8 hours-I was conservative in her dosing yesterday and based on her IV tolerance and meds she can easily tolerate 30 mg TID, if sedation develops can hold and reassess dosing. 3. Continue to leave hydrocodone prn for breakthrough pain, she has been on this for a long time and has attachment to it as the drug that provided her relief prior to this surgery. 4. She requested I stop the low dose benzodiazapine for sleep and muscle spasm because she think that "killed her father-in-law" and didn't help her sleep. 5. Continue Mobic+omeprazole. No bleed risks or contraindications. 6. Continue Neurontin at high doses-plenty of room to go up on that. 8. She has still not had an effective BM, small BM after enema last PM- she is requesting MOM because this has worked for her in the past. Will continue Miralax. 9. Saline lock IV to encourage mobility and also encourage oral fluids. 10 Started TID Baclofen scheduled, was getting Robaxin prn but not helpful with muscle spasm control.   Time In: 4PM Time Out: 5PM Total Time Spent with Patient: 60 minutes Total Overall Time: 60 minutes   Greater than 50%  of this time was spent counseling and coordinating care related to the above assessment and plan.   Edsel Petrin, DO  09/08/2012, 4:31 PM  Please contact Palliative Medicine Team phone at [phone] for questions and concerns.

## 2012-09-08 NOTE — Progress Notes (Signed)
PT Cancellation Note  Patient Details Name: [name] MRN: [id number] DOB: [birth year]   Cancelled Treatment:    Reason Eval/Treat Not Completed: Pain limiting ability to participate   Fredrich Birks 09/08/2012, 2:00 PM

## 2012-09-08 NOTE — Progress Notes (Signed)
Occupational Therapy Treatment Patient Details Name: [name] MRN: [id number] DOB: 06/06/1956 Today's Date: 09/08/2012 Time: [phone] (spoke to RN called facilities for chair) OT Time Calculation (min): 53 min  OT Assessment / Plan / Recommendation Comments on Treatment Session Pt does not require the TLSO now per Dr Alveda Reasons. Pt progressing slowly with increased pain. Pt states "i did much better yesterday." Pt progressing slowly.    Follow Up Recommendations  Home health OT    Barriers to Discharge       Equipment Recommendations  None recommended by OT    Recommendations for Other Services    Frequency Min 3X/week   Plan Discharge plan remains appropriate    Precautions / Restrictions Precautions Precautions: Fall;Back Required Braces or Orthoses: Spinal Brace Spinal Brace: Thoracolumbosacral orthotic;Applied in standing position (Per Dr Alveda Reasons okay to complete transfer without brace) Restrictions Weight Bearing Restrictions: No   Pertinent Vitals/Pain Severe spasms    ADL  Eating/Feeding: Set up Where Assessed - Eating/Feeding: Bed level Toilet Transfer: Minimal assistance Toilet Transfer Method: Sit to stand Toilet Transfer Equipment: Raised toilet seat with arms (or 3-in-1 over toilet) Toileting - Clothing Manipulation and Hygiene: Minimal assistance Where Assessed - Toileting Clothing Manipulation and Hygiene: Sit to stand from 3-in-1 or toilet (using toilet tongs for hygiene) Equipment Used: Rolling walker Transfers/Ambulation Related to ADLs: pt completed supine<>sit and stand pivot to 3n1 at bedside ADL Comments: Pt on arrival in severe pain on arrival supine with muscle spasms> pt educated on bed mobility to reduce muscle spasms. pt agreeable due to discomfort. pt with good recall of sequence for log rolling. Pt requesting 3n1 after bed mobility. pt completed transfer and using tongs for toilet aide. Pt repositioned supine with ice and reports decreased  discomfort with ice. Facilities called to have high back chair delivered from 3100 unit to room. The chair is to be returned to 3100 unit. Pt reports having 3 medications taken PTA for pain management. RN adrienne informed of patients PTA medication management and will address prior to shift change.     OT Diagnosis:    OT Problem List:   OT Treatment Interventions:     OT Goals Acute Rehab OT Goals OT Goal Formulation: With patient Time For Goal Achievement: 09/12/12 Potential to Achieve Goals: Good ADL Goals Pt Will Perform Grooming: with supervision;Standing at sink ADL Goal: Grooming - Progress: Progressing toward goals Pt Will Perform Upper Body Bathing: with set-up;Sitting, chair;Sitting, edge of bed Pt Will Perform Lower Body Bathing: with supervision;Sit to stand from chair;Sit to stand from bed Pt Will Perform Upper Body Dressing: with set-up;Sitting, chair;Sitting, bed Pt Will Perform Lower Body Dressing: with supervision;Sit to stand from chair;Sit to stand from bed;with adaptive equipment Pt Will Transfer to Toilet: with supervision;Ambulation;Comfort height toilet ADL Goal: Toilet Transfer - Progress: Progressing toward goals Pt Will Perform Toileting - Clothing Manipulation: with supervision;Standing ADL Goal: Toileting - Clothing Manipulation - Progress: Progressing toward goals Pt Will Perform Toileting - Hygiene: with supervision;Sit to stand from 3-in-1/toilet ADL Goal: Toileting - Hygiene - Progress: Met Pt Will Perform Tub/Shower Transfer: Shower transfer;with supervision;Ambulation;Grab bars  Visit Information  Last OT Received On: 09/08/12 Assistance Needed: +1    Subjective Data  Subjective: "there must be 10 huge staples back there because I can not lay on my back"- pt uncomfortable except on Left side Patient Stated Goal: "I would beg for one of those chairs they had last time that does not have a recliner part"- pt discussing  discomfort of recliner present in  room   Prior Functioning       Cognition  Overall Cognitive Status: Appears within functional limits for tasks assessed/performed Arousal/Alertness: Awake/alert Orientation Level: Oriented X4 / Intact Behavior During Session: [facility] for tasks performed    Mobility  Shoulder Instructions Bed Mobility Rolling Right: 4: Min assist Rolling Left: 4: Min assist Right Sidelying to Sit: 4: Min guard Supine to Sit: 3: Mod assist Sitting - Scoot to Edge of Bed: 4: Min assist Details for Bed Mobility Assistance: pt requires (A) to lift LE into bed and UE assistance to pull self into sitting Transfers Sit to Stand: 4: Min assist;With upper extremity assist;From bed Stand to Sit: 4: Min assist;With upper extremity assist;To bed Details for Transfer Assistance: good hand placement       Exercises      Balance     End of Session OT - End of Session Activity Tolerance: Patient tolerated treatment well Patient left: in bed;with call bell/phone within reach Nurse Communication: Mobility status;Other (comment)  GO     Harrel Carina [facility] 09/08/2012, 3:57 PM  Pager: [phone]

## 2012-09-08 NOTE — Progress Notes (Signed)
Seen in room 5N20 this AM  S: C/O +++ pain with radiation into upper thighs  O: lying on right side in bed in obvious severe pain, almost non-communicative - although alert   A: Inadequate pain control, I think she well need SNF placenment, but cannot go anywhere until pain management is in a ssteady state  P: I have increased gabapentin to 800 mg tid; consider increasing MS contin vs use of a fentanyl patch.

## 2012-09-09 MED ORDER — FENTANYL 50 MCG/HR TD PT72
50.0000 ug | MEDICATED_PATCH | TRANSDERMAL | Status: DC
  Administered 2012-09-09: 50 ug via TRANSDERMAL
  Filled 2012-09-09: qty 1

## 2012-09-09 MED ORDER — METHOCARBAMOL 750 MG PO TABS: 750.0000 mg | ORAL_TABLET | Freq: Three times a day (TID) | ORAL | Status: AC | PRN

## 2012-09-09 MED ORDER — FENTANYL 25 MCG/HR TD PT72: 1.0000 | MEDICATED_PATCH | TRANSDERMAL | Status: AC

## 2012-09-09 MED ORDER — HYDROCODONE-ACETAMINOPHEN 10-325 MG PO TABS: 1.0000 | ORAL_TABLET | ORAL | Status: AC | PRN

## 2012-09-09 MED ORDER — GABAPENTIN 400 MG PO CAPS: 800.0000 mg | ORAL_CAPSULE | Freq: Three times a day (TID) | ORAL | Status: AC

## 2012-09-09 MED ORDER — METHOCARBAMOL 750 MG PO TABS
750.0000 mg | ORAL_TABLET | Freq: Three times a day (TID) | ORAL | Status: DC | PRN
  Administered 2012-09-09 – 2012-09-10 (×3): 750 mg via ORAL
  Filled 2012-09-09 (×3): qty 1

## 2012-09-09 NOTE — Discharge Summary (Signed)
Discharge dictation [phone]

## 2012-09-09 NOTE — Progress Notes (Signed)
CARE MANAGEMENT NOTE 09/09/2012  Patient:  [name], [name]   Account Number:  [id number]  Date Initiated:  09/05/2012  Documentation initiated by:  Vance Peper  Subjective/Objective Assessment:   56 yr old female s/p T8-S1 fusion and bone graft harvest, removal of spinal cord stimulator     Action/Plan:   No home health needs identified ny PT or OT. Patient has all necessary DME.  09/06/12- Pt hasnt been able to work with patient.   Anticipated DC Date:  09/09/2012   Anticipated DC Plan:  HOME W HOME HEALTH SERVICES  In-house referral  Clinical Social Worker      DC Associate Professor  CM consult      [facility] Choice  HOME HEALTH   Choice offered to / List presented to:          [facility] arranged  HH-1 RN  HH-2 PT  HH-3 OT  HH-6 SOCIAL WORKER      HH agency  [facility]   Status of service:  Completed, signed off Medicare Important Message given?  NO (If response is "NO", the following Medicare IM given date fields will be blank) Date Medicare IM given:   Date Additional Medicare IM given:    Discharge Disposition:  HOME W HOME HEALTH SERVICES  Per UR Regulation:  Reviewed for med. necessity/level of care/duration of stay  If discussed at Long Length of Stay Meetings, dates discussed:    Comments:  09/09/12 2:25pm Vance Peper, RN BSN Case manager Patient will be going home instead of SNF. States she uses [facility].

## 2012-09-09 NOTE — Progress Notes (Signed)
Physical Therapy Treatment Patient Details Name: [name] MRN: [id number] DOB: [birth year] Today's Date: 09/09/2012 Time: 4098-1191 PT Time Calculation (min): 34 min  PT Assessment / Plan / Recommendation Comments on Treatment Session  Patient is making great progress today. States she was able to orgainize her medication and now feels "10,000x's better". Patient ambulating well. Still awaiting SNF as patient feels her husband cannot provide her with ADL assistance needed at this time    Follow Up Recommendations  SNF;Supervision/Assistance - 24 hour     Does the patient have the potential to tolerate intense rehabilitation     Barriers to Discharge        Equipment Recommendations  None recommended by PT;None recommended by OT    Recommendations for Other Services    Frequency Min 5X/week   Plan Discharge plan remains appropriate;Frequency remains appropriate    Precautions / Restrictions Precautions Precautions: Back Precaution Comments: Patient able to recall all precautions and maintain with mobility Spinal Brace:  (Per Dr. Alveda Reasons. Okay to not use TLSO at this time due to incr)   Pertinent Vitals/Pain     Mobility  Bed Mobility Rolling Left: 5: Supervision;With rail Right Sidelying to Sit: 5: Supervision;With rails Transfers Sit to Stand: 4: Min guard;From toilet;From bed;With upper extremity assist Stand to Sit: To toilet;To bed;4: Min guard;With upper extremity assist Details for Transfer Assistance: MinGuard for safety Ambulation/Gait Ambulation/Gait Assistance: 4: Min guard Ambulation Distance (Feet): 250 Feet Assistive device: Rolling walker Ambulation/Gait Assistance Details: Patient with good posture and safe use of RW Gait Pattern: Step-through pattern;Decreased stride length Gait velocity: decreased    Exercises     PT Diagnosis:    PT Problem List:   PT Treatment Interventions:     PT Goals Acute Rehab PT Goals PT Goal: Supine/Side to Sit -  Progress: Progressing toward goal PT Goal: Sit to Stand - Progress: Progressing toward goal PT Goal: Stand to Sit - Progress: Progressing toward goal PT Goal: Ambulate - Progress: Progressing toward goal  Visit Information  Last PT Received On: 09/09/12 Assistance Needed: +1    Subjective Data      Cognition  Overall Cognitive Status: Appears within functional limits for tasks assessed/performed Arousal/Alertness: Awake/alert Orientation Level: Appears intact for tasks assessed Behavior During Session: [facility] for tasks performed    Balance     End of Session PT - End of Session Equipment Utilized During Treatment: Gait belt Activity Tolerance: Patient tolerated treatment well Patient left: in bed;with call bell/phone within reach Nurse Communication: Mobility status   GP     Fredrich Birks 09/09/2012, 8:48 AM 09/09/2012 Fredrich Birks PTA [phone] pager [phone] office

## 2012-09-09 NOTE — Progress Notes (Signed)
Not seen yet today, but I have noted that she has been placed on NSAID's.  These are contraindicated in fusion cases because they inhibit bone graft incorporation.

## 2012-09-09 NOTE — Progress Notes (Signed)
Seen in Room 5N20 this afternoon, [name] here at the same time.  Patient's insurance does not cover SNF placement in proximity to her home.    S: pain control much better now, agreeable to going home tomorrow  O: lying fairly comfortably  A: stable  P: home tomorrow with home health care, should have staples removed in one week, 13 Dec., 2013

## 2012-09-09 NOTE — Progress Notes (Signed)
CARE MANAGEMENT NOTE 09/09/2012  Patient:  [name], [name]   Account Number:  [id number]  Date Initiated:  09/05/2012  Documentation initiated by:  Vance Peper  Subjective/Objective Assessment:   56 yr old female s/p T8-S1 fusion and bone graft harvest, removal of spinal cord stimulator     Action/Plan:   No home health needs identified ny PT or OT. Patient has all necessary DME.  09/06/12- Pt hasnt been able to work with patient.   Anticipated DC Date:  09/09/2012   Anticipated DC Plan:  SKILLED NURSING FACILITY  In-house referral  Clinical Social Worker      DC Planning Services  CM consult      Choice offered to / List presented to:             Status of service:  Completed, signed off Medicare Important Message given?  NO (If response is "NO", the following Medicare IM given date fields will be blank) Date Medicare IM given:   Date Additional Medicare IM given:    Discharge Disposition:  SKILLED NURSING FACILITY  Per UR Regulation:  Reviewed for med. necessity/level of care/duration of stay  If discussed at Long Length of Stay Meetings, dates discussed:    Comments:

## 2012-09-09 NOTE — Progress Notes (Signed)
Utilization review completed. Bexley Mclester, RN, BSN. 

## 2012-09-09 NOTE — Progress Notes (Signed)
Patient accepted a bed at [facility] yesterday and we are waiting on insurance approval.  Sabino Niemann, MSW,  [phone]

## 2012-09-10 NOTE — Discharge Summary (Signed)
[name], [name] NO.:  [id number]  MEDICAL RECORD NO.:  [id number]  LOCATION:  5N20C                        FACILITY:  [facility]  PHYSICIAN:  Nelda Severe, MD      DATE OF BIRTH:  Jul 16, 1956  DATE OF ADMISSION:  09/02/2012 DATE OF DISCHARGE:                              DISCHARGE SUMMARY   DISCHARGE DIAGNOSES:  Lumbar spondylosis, thoracolumbar scoliosis, status post lumbar laminectomy, status post first stage anterior L4-5 and L5-S1 fusion.  The patient was taken to the operating room the day of admission, where a posterior instrumentation and fusion from T7 through S1 was carried out with bone graft harvest, iliac screws, etc.  Postoperatively, she was nursed in the Intensive Care Unit for approximately 2 days.  CT scan was done postoperatively because of fairly profound right leg weakness which resolved completely.  The CT scan did not demonstrate any cause of the right leg weakness, and its cause remains elusive.  In any event, she had full strength within about 3 days of the surgery.  She was transferred to [address] [place] were pain control was the main issue.  At the present time, she has been placed on the fentanyl patch with Norco 10 for breakthrough pain and Robaxin 750 mg q.8h p.r.n.  This seems to have her in a steady state.  She is ambulatory with a walker and physical therapy.  She has been fitted with a TLSO which she does not tolerate very well, but which she has been cautioned to use when she is in the OR.  Her staples are in situ currently and should be taken out by a home health nurse in 1 week's time, September 16, 2012.  She should return for followup in the office in approximately 5-6 weeks' time.  She may shower at any time.  CONDITION ON DISCHARGE:  Stable, ambulatory with a walker, pain control, adequate.     Nelda Severe, MD    MT/MEDQ  D:  09/09/2012  T:  09/10/2012  Job:  [id number]

## 2013-01-27 ENCOUNTER — Other Ambulatory Visit: Payer: Self-pay | Admitting: Orthopedic Surgery

## 2013-01-27 DIAGNOSIS — M545 Low back pain: Secondary | ICD-10-CM

## 2013-02-24 ENCOUNTER — Other Ambulatory Visit: Payer: BC Managed Care – PPO

## 2013-03-03 ENCOUNTER — Ambulatory Visit
Admission: RE | Admit: 2013-03-03 | Discharge: 2013-03-03 | Disposition: A | Discharge status: Home / Self Care | Payer: BC Managed Care – PPO | Source: Ambulatory Visit | Attending: Orthopedic Surgery | Admitting: Orthopedic Surgery

## 2013-03-03 DIAGNOSIS — M545 Low back pain: Secondary | ICD-10-CM

## 2014-02-28 ENCOUNTER — Other Ambulatory Visit: Payer: Self-pay | Admitting: Orthopaedic Surgery

## 2014-02-28 ENCOUNTER — Ambulatory Visit
Admission: RE | Admit: 2014-02-28 | Discharge: 2014-02-28 | Disposition: A | Discharge status: Home / Self Care | Payer: Medicare Other | Source: Ambulatory Visit | Attending: Orthopaedic Surgery | Admitting: Orthopaedic Surgery

## 2014-02-28 VITALS — BP 138/56 | HR 64

## 2014-02-28 DIAGNOSIS — M549 Dorsalgia, unspecified: Secondary | ICD-10-CM

## 2014-02-28 MED ORDER — METHYLPREDNISOLONE ACETATE 40 MG/ML INJ SUSP (RADIOLOG
120.0000 mg | Freq: Once | INTRAMUSCULAR | Status: AC
  Administered 2014-02-28: 120 mg via EPIDURAL

## 2014-02-28 MED ORDER — IOHEXOL 180 MG/ML  SOLN
1.0000 mL | Freq: Once | INTRAMUSCULAR | Status: AC | PRN
  Administered 2014-02-28: 1 mL via EPIDURAL

## 2014-02-28 NOTE — Discharge Instructions (Signed)

## 2014-03-09 IMAGING — CT CT T SPINE W/O CM
3 of 10 series · 10 of 33 positions shown, 11 images · non-contrast
Comparison: 09/02/2012

CT THORACIC SPINE

CLINICAL DATA: Thoracic and lumbar fusion.  Check hardware
placement

CT THORACIC AND LUMBAR SPINE WITHOUT CONTRAST
TECHNIQUE: Multidetector CT imaging of the thoracic and lumbar
spine was performed without contrast. Multiplanar CT image
reconstructions were also generated.

[Series 3: 2mm bone · axial · 0.30mm/px · z∈[+972,+1148]mm · 2 of 265 slices shown]
[im 89/265  bone]
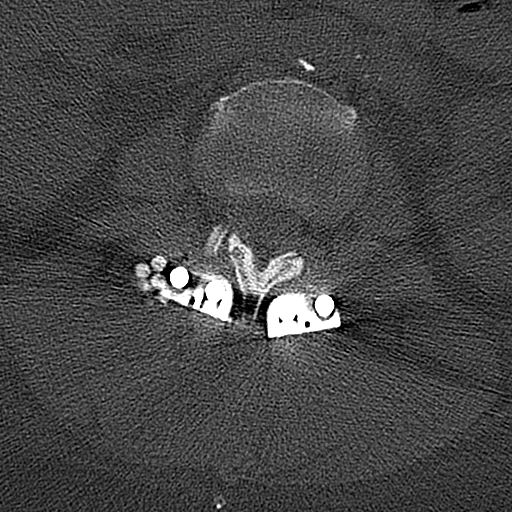
[im 177/265  bone]
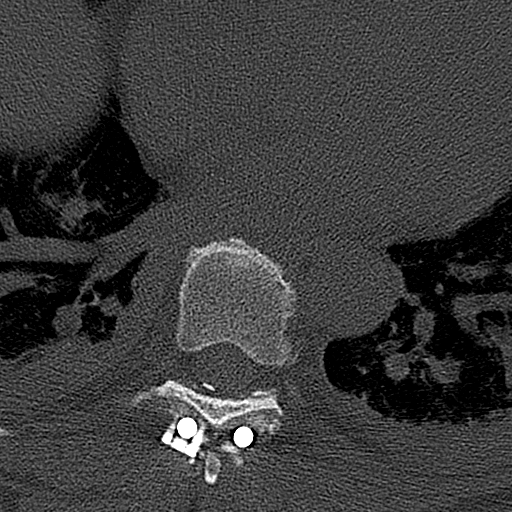

[Series 9: t and l spine st · axial · 0.31mm/px · z∈[+914,+1188]mm · 3 of 275 slices shown, 4 images]
[im 69/275  soft-tissue]
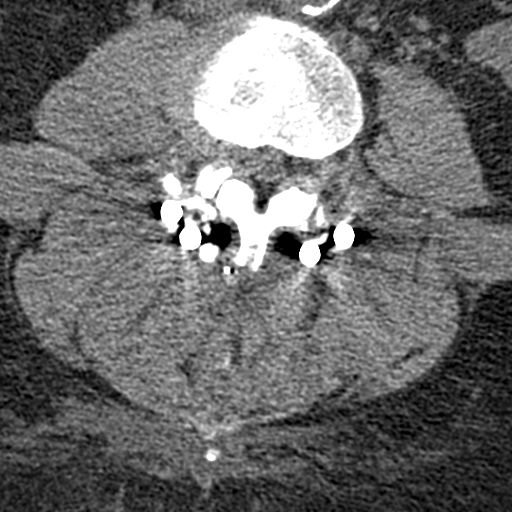
[im 69/275  bone]
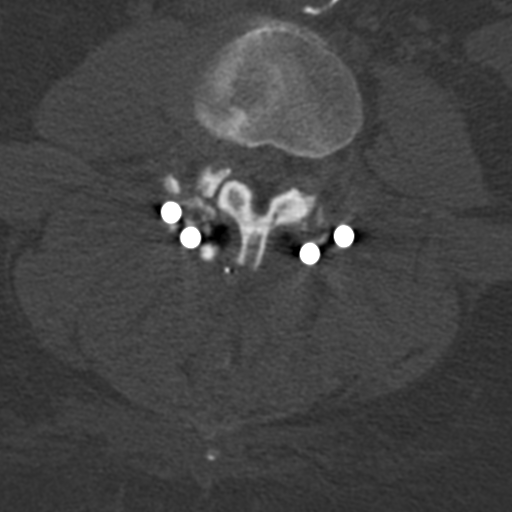
[im 138/275  bone]
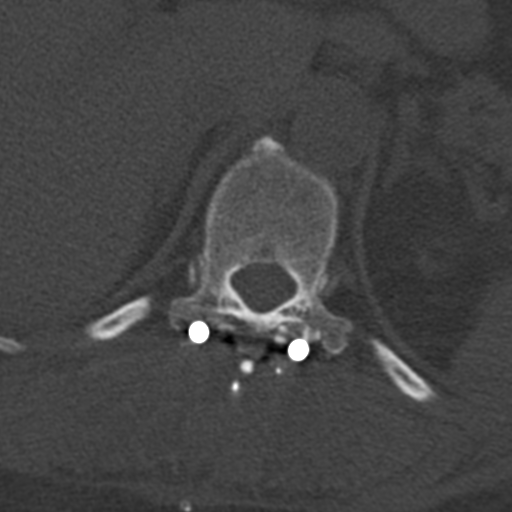
[im 206/275  bone]
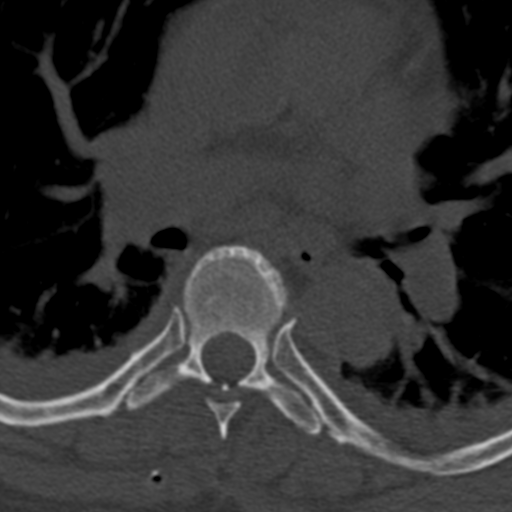

[Series 607: sag l spine · sagittal · 0.50mm/px · 5 of 43 slices shown]
[im 8/43  bone]
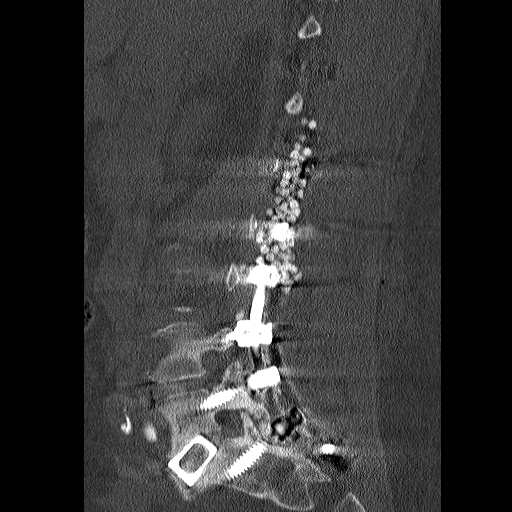
[im 15/43  bone]
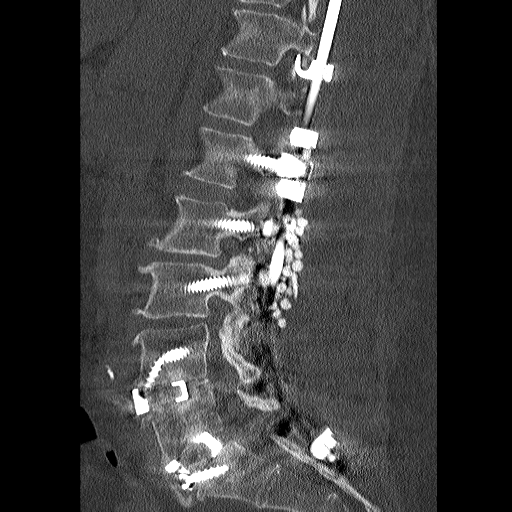
[im 22/43  bone]
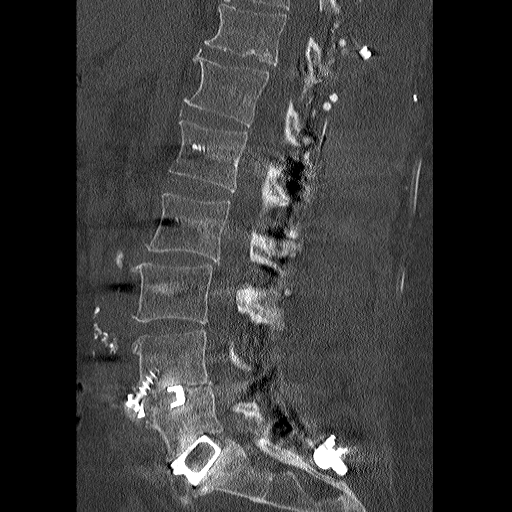
[im 29/43  bone]
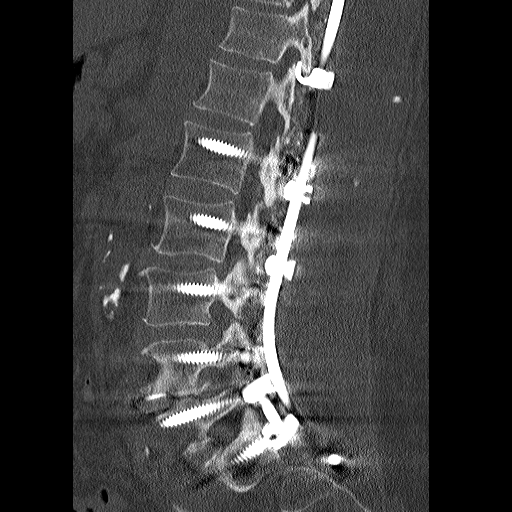
[im 36/43  bone]
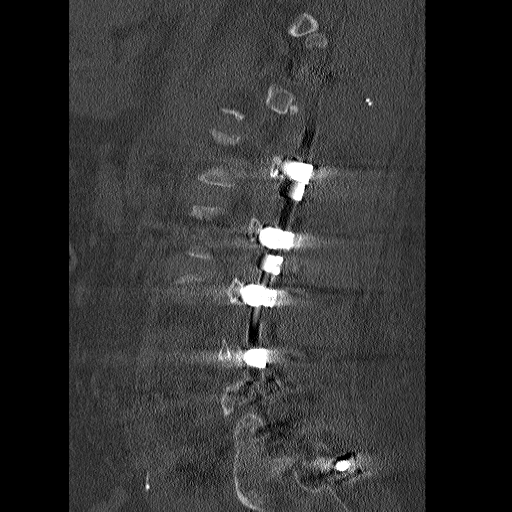

[10 of 33 positions shown; findings below may reference images not displayed]

FINDINGS: Thoracic dextroscoliosis measuring 14 degrees at T5-6.
Negative for fracture or mass lesion.  There is mild thoracic disc
degeneration from T8-T12 with mild disc space narrowing and small
Schmorl's nodes.  No significant spondylosis.

Posterior rod fusion from T7-S1.  Laminar hooks are present on T7,
T8, T9, and T11 in satisfactory position.  Posterior rods are
present bilaterally.  Posterior lateral bone graft material is
present in the soft tissues.  Bone stimulator is in place with a
wire located along the bone graft material on the right in the
thoracic spine.

There is mild bibasilar atelectasis the lungs.  No hematoma or
fluid collection is present.
IMPRESSION: Thoracic scoliosis.  Thoracic and lumbar fusion from T7-S1.
Hardware in the thoracic spine in satisfactory position.

CT LUMBAR SPINE
FINDINGS: Lumbar levoscoliosis measures 19 degrees at L2-3.
Negative for fracture or mass lesion.  Mild posterior slip L2-3
measuring approximately 3 mm.

Posterior rod fusion from T7-S1.  Laminar hooks at T11 in
satisfactory position.  Bilateral pedicle screws are in
satisfactory position at L1, L2, L3, L4, L5, and S1.  There also
are screws extending into the iliac bone bilaterally.  Posterior
bone graft material is present around the facet joints and lamina
throughout the lumbar spine.  Interbody fusion with metal spacer
and anterior screws at L4-5 and L5-S1 in satisfactory position.

Evaluation of the spinal canal is limited due to extensive streak
artifact from hardware.
IMPRESSION: Focal lumbar fusion as above.  Hardware is in satisfactory
position.  No acute fracture or immediate complication.
